# Patient Record
Sex: Female | Born: 1970 | Race: White | Hispanic: No | Marital: Married | Smoking: Never smoker
Health system: Southern US, Community
[De-identification: ages and names within clinical notes are randomized; demographics above are authoritative.]

## PROBLEM LIST (undated history)

## (undated) DIAGNOSIS — E039 Hypothyroidism, unspecified: Secondary | ICD-10-CM

## (undated) DIAGNOSIS — D689 Coagulation defect, unspecified: Secondary | ICD-10-CM

## (undated) DIAGNOSIS — R011 Cardiac murmur, unspecified: Secondary | ICD-10-CM

## (undated) DIAGNOSIS — Z5189 Encounter for other specified aftercare: Secondary | ICD-10-CM

## (undated) DIAGNOSIS — M1990 Unspecified osteoarthritis, unspecified site: Secondary | ICD-10-CM

## (undated) DIAGNOSIS — IMO0001 Reserved for inherently not codable concepts without codable children: Secondary | ICD-10-CM

## (undated) HISTORY — DX: Cardiac murmur, unspecified: R01.1

## (undated) HISTORY — PX: ECTOPIC PREGNANCY SURGERY: SHX613

## (undated) HISTORY — DX: Hypothyroidism, unspecified: E03.9

## (undated) HISTORY — PX: CERVICAL BIOPSY: SHX590

## (undated) HISTORY — DX: Unspecified osteoarthritis, unspecified site: M19.90

## (undated) HISTORY — DX: Encounter for other specified aftercare: Z51.89

## (undated) HISTORY — DX: Coagulation defect, unspecified: D68.9

## (undated) HISTORY — PX: NASAL SEPTUM SURGERY: SHX37

## (undated) HISTORY — PX: CARDIAC CATHETERIZATION: SHX172

## (undated) HISTORY — PX: TONSILLECTOMY: SUR1361

## (undated) HISTORY — DX: Reserved for inherently not codable concepts without codable children: IMO0001

---

## 1997-05-26 ENCOUNTER — Other Ambulatory Visit: Admission: RE | Admit: 1997-05-26 | Discharge: 1997-05-26 | Payer: Self-pay | Admitting: Otolaryngology

## 1997-11-07 ENCOUNTER — Other Ambulatory Visit: Admission: RE | Admit: 1997-11-07 | Discharge: 1997-11-07 | Payer: Self-pay | Admitting: Obstetrics and Gynecology

## 1998-11-02 ENCOUNTER — Other Ambulatory Visit: Admission: RE | Admit: 1998-11-02 | Discharge: 1998-11-02 | Payer: Self-pay | Admitting: Obstetrics and Gynecology

## 1998-11-09 ENCOUNTER — Emergency Department (HOSPITAL_COMMUNITY): Admission: EM | Admit: 1998-11-09 | Discharge: 1998-11-09 | Payer: Self-pay | Admitting: *Deleted

## 1999-07-11 ENCOUNTER — Encounter: Payer: Self-pay | Admitting: Emergency Medicine

## 1999-07-11 ENCOUNTER — Inpatient Hospital Stay (HOSPITAL_COMMUNITY): Admission: EM | Admit: 1999-07-11 | Discharge: 1999-07-13 | Payer: Self-pay | Admitting: Emergency Medicine

## 1999-07-12 ENCOUNTER — Encounter: Payer: Self-pay | Admitting: Cardiovascular Disease

## 1999-12-06 ENCOUNTER — Other Ambulatory Visit: Admission: RE | Admit: 1999-12-06 | Discharge: 1999-12-06 | Payer: Self-pay | Admitting: Otolaryngology

## 1999-12-06 ENCOUNTER — Encounter (INDEPENDENT_AMBULATORY_CARE_PROVIDER_SITE_OTHER): Payer: Self-pay | Admitting: Specialist

## 1999-12-10 ENCOUNTER — Observation Stay (HOSPITAL_COMMUNITY): Admission: EM | Admit: 1999-12-10 | Discharge: 1999-12-11 | Payer: Self-pay | Admitting: Emergency Medicine

## 1999-12-15 ENCOUNTER — Emergency Department (HOSPITAL_COMMUNITY): Admission: EM | Admit: 1999-12-15 | Discharge: 1999-12-15 | Payer: Self-pay | Admitting: Emergency Medicine

## 1999-12-20 ENCOUNTER — Other Ambulatory Visit: Admission: RE | Admit: 1999-12-20 | Discharge: 1999-12-20 | Payer: Self-pay | Admitting: Obstetrics and Gynecology

## 2001-05-29 ENCOUNTER — Other Ambulatory Visit: Admission: RE | Admit: 2001-05-29 | Discharge: 2001-05-29 | Payer: Self-pay | Admitting: Obstetrics and Gynecology

## 2002-01-13 ENCOUNTER — Inpatient Hospital Stay (HOSPITAL_COMMUNITY): Admission: AD | Admit: 2002-01-13 | Discharge: 2002-01-15 | Payer: Self-pay | Admitting: Obstetrics and Gynecology

## 2002-01-13 ENCOUNTER — Encounter: Payer: Self-pay | Admitting: Emergency Medicine

## 2002-01-13 ENCOUNTER — Encounter (INDEPENDENT_AMBULATORY_CARE_PROVIDER_SITE_OTHER): Payer: Self-pay

## 2002-01-20 ENCOUNTER — Inpatient Hospital Stay (HOSPITAL_COMMUNITY): Admission: AD | Admit: 2002-01-20 | Discharge: 2002-01-20 | Payer: Self-pay | Admitting: Obstetrics and Gynecology

## 2002-08-13 ENCOUNTER — Other Ambulatory Visit: Admission: RE | Admit: 2002-08-13 | Discharge: 2002-08-13 | Payer: Self-pay | Admitting: Obstetrics and Gynecology

## 2003-11-04 ENCOUNTER — Other Ambulatory Visit: Admission: RE | Admit: 2003-11-04 | Discharge: 2003-11-04 | Payer: Self-pay | Admitting: Obstetrics and Gynecology

## 2004-04-11 ENCOUNTER — Ambulatory Visit: Payer: Self-pay | Admitting: Internal Medicine

## 2004-04-11 ENCOUNTER — Encounter: Admission: RE | Admit: 2004-04-11 | Discharge: 2004-04-11 | Payer: Self-pay | Admitting: Internal Medicine

## 2004-11-20 ENCOUNTER — Ambulatory Visit: Payer: Self-pay | Admitting: Internal Medicine

## 2005-02-26 ENCOUNTER — Ambulatory Visit: Payer: Self-pay

## 2005-02-26 ENCOUNTER — Ambulatory Visit: Payer: Self-pay | Admitting: *Deleted

## 2005-02-26 ENCOUNTER — Observation Stay (HOSPITAL_COMMUNITY): Admission: AD | Admit: 2005-02-26 | Discharge: 2005-02-27 | Payer: Self-pay | Admitting: Cardiovascular Disease

## 2005-03-21 ENCOUNTER — Ambulatory Visit: Payer: Self-pay | Admitting: Cardiovascular Disease

## 2005-06-14 ENCOUNTER — Other Ambulatory Visit: Admission: RE | Admit: 2005-06-14 | Discharge: 2005-06-14 | Payer: Self-pay | Admitting: Obstetrics and Gynecology

## 2005-08-05 ENCOUNTER — Ambulatory Visit (HOSPITAL_COMMUNITY): Admission: RE | Admit: 2005-08-05 | Discharge: 2005-08-05 | Payer: Self-pay | Admitting: Obstetrics and Gynecology

## 2005-08-05 ENCOUNTER — Encounter (INDEPENDENT_AMBULATORY_CARE_PROVIDER_SITE_OTHER): Payer: Self-pay | Admitting: *Deleted

## 2005-08-19 ENCOUNTER — Inpatient Hospital Stay (HOSPITAL_COMMUNITY): Admission: AD | Admit: 2005-08-19 | Discharge: 2005-08-19 | Payer: Self-pay | Admitting: Obstetrics and Gynecology

## 2005-08-21 ENCOUNTER — Inpatient Hospital Stay (HOSPITAL_COMMUNITY): Admission: AD | Admit: 2005-08-21 | Discharge: 2005-08-22 | Payer: Self-pay | Admitting: Obstetrics and Gynecology

## 2005-09-09 ENCOUNTER — Ambulatory Visit: Payer: Self-pay | Admitting: Hematology and Oncology

## 2005-09-09 ENCOUNTER — Inpatient Hospital Stay (HOSPITAL_COMMUNITY): Admission: AD | Admit: 2005-09-09 | Discharge: 2005-09-09 | Payer: Self-pay | Admitting: Obstetrics and Gynecology

## 2005-09-17 LAB — CBC WITH DIFFERENTIAL/PLATELET
Basophils Absolute: 0 10*3/uL (ref 0.0–0.1)
MCH: 30.3 pg (ref 26.0–34.0)
MCV: 89.5 fL (ref 81.0–101.0)
MONO#: 0.6 10*3/uL (ref 0.1–0.9)
MONO%: 11 % (ref 0.0–13.0)
NEUT#: 2.7 10*3/uL (ref 1.5–6.5)
NEUT%: 49.6 % (ref 39.6–76.8)
RBC: 3.87 10*6/uL (ref 3.70–5.32)
RDW: 14.2 % (ref 11.3–14.5)
WBC: 5.5 10*3/uL (ref 3.9–10.0)

## 2005-09-17 LAB — IVY BLEEDING TIME: Bleeding Time: 7.5 Minutes (ref 2.0–8.0)

## 2005-09-17 LAB — FIBRINOGEN: Fibrinogen: 280 mg/dL (ref 204–475)

## 2005-09-17 LAB — COMPREHENSIVE METABOLIC PANEL
AST: 20 U/L (ref 0–37)
CO2: 30 mEq/L (ref 19–32)
Calcium: 9.1 mg/dL (ref 8.4–10.5)
Glucose, Bld: 80 mg/dL (ref 70–99)
Total Protein: 6.7 g/dL (ref 6.0–8.3)

## 2005-09-17 LAB — PROTHROMBIN TIME: Prothrombin Time: 13.7 seconds (ref 11.6–15.2)

## 2005-09-21 LAB — VON WILLEBRAND FACTOR MULTIMER
Ristocetin-Cofactor: 136 % (ref 50–150)
Von Willebrand Multimers: NORMAL

## 2006-02-24 ENCOUNTER — Ambulatory Visit: Payer: Self-pay | Admitting: Cardiovascular Disease

## 2006-12-17 ENCOUNTER — Telehealth: Payer: Self-pay | Admitting: Internal Medicine

## 2007-02-06 ENCOUNTER — Ambulatory Visit (HOSPITAL_BASED_OUTPATIENT_CLINIC_OR_DEPARTMENT_OTHER): Admission: RE | Admit: 2007-02-06 | Discharge: 2007-02-06 | Payer: Self-pay | Admitting: Urology

## 2007-03-25 ENCOUNTER — Ambulatory Visit: Payer: Self-pay | Admitting: Cardiovascular Disease

## 2007-05-15 ENCOUNTER — Ambulatory Visit: Payer: Self-pay

## 2007-05-15 ENCOUNTER — Encounter: Payer: Self-pay | Admitting: Cardiovascular Disease

## 2008-03-18 ENCOUNTER — Telehealth: Payer: Self-pay | Admitting: Internal Medicine

## 2008-11-23 ENCOUNTER — Telehealth: Payer: Self-pay | Admitting: Cardiovascular Disease

## 2008-11-24 DIAGNOSIS — R079 Chest pain, unspecified: Secondary | ICD-10-CM

## 2008-11-24 DIAGNOSIS — I209 Angina pectoris, unspecified: Secondary | ICD-10-CM

## 2008-11-24 DIAGNOSIS — F988 Other specified behavioral and emotional disorders with onset usually occurring in childhood and adolescence: Secondary | ICD-10-CM | POA: Insufficient documentation

## 2008-11-25 ENCOUNTER — Ambulatory Visit: Payer: Self-pay | Admitting: Internal Medicine

## 2008-11-28 ENCOUNTER — Ambulatory Visit: Payer: Self-pay | Admitting: Internal Medicine

## 2008-12-02 ENCOUNTER — Telehealth: Payer: Self-pay | Admitting: Internal Medicine

## 2008-12-02 LAB — CONVERTED CEMR LAB
HDL: 51.6 mg/dL (ref >39.00)
Triglycerides: 55 mg/dL (ref 0.0–149.0)
VLDL: 11 mg/dL (ref 0.0–40.0)

## 2008-12-08 ENCOUNTER — Ambulatory Visit: Payer: Self-pay

## 2008-12-08 ENCOUNTER — Ambulatory Visit: Payer: Self-pay | Admitting: Cardiovascular Disease

## 2008-12-08 ENCOUNTER — Encounter: Payer: Self-pay | Admitting: Internal Medicine

## 2008-12-08 ENCOUNTER — Ambulatory Visit (HOSPITAL_COMMUNITY): Admission: RE | Admit: 2008-12-08 | Discharge: 2008-12-08 | Payer: Self-pay | Admitting: Cardiovascular Disease

## 2008-12-12 ENCOUNTER — Telehealth: Payer: Self-pay | Admitting: Internal Medicine

## 2009-03-14 ENCOUNTER — Encounter: Payer: Self-pay | Admitting: Internal Medicine

## 2009-03-14 ENCOUNTER — Encounter: Admission: RE | Admit: 2009-03-14 | Discharge: 2009-03-14 | Payer: Self-pay | Admitting: Allergy

## 2009-06-20 ENCOUNTER — Encounter: Admission: RE | Admit: 2009-06-20 | Discharge: 2009-06-20 | Payer: Self-pay | Admitting: Obstetrics and Gynecology

## 2010-03-29 NOTE — Letter (Signed)
Summary: [facility]  [facility]   Imported By: Sherian Rein 03/27/2009 12:09:56  _____________________________________________________________________  External Attachment:    Type:   Image     Comment:   External Document

## 2010-06-15 ENCOUNTER — Other Ambulatory Visit: Payer: Self-pay | Admitting: Obstetrics and Gynecology

## 2010-06-15 DIAGNOSIS — N644 Mastodynia: Secondary | ICD-10-CM

## 2010-06-18 ENCOUNTER — Observation Stay (HOSPITAL_COMMUNITY)
Admission: EM | Admit: 2010-06-18 | Discharge: 2010-06-19 | Disposition: A | Discharge status: Home / Self Care | Payer: BC Managed Care – PPO | Attending: Orthopedic Surgery | Admitting: Orthopedic Surgery

## 2010-06-18 DIAGNOSIS — Y998 Other external cause status: Secondary | ICD-10-CM | POA: Insufficient documentation

## 2010-06-18 DIAGNOSIS — S61409A Unspecified open wound of unspecified hand, initial encounter: Principal | ICD-10-CM | POA: Insufficient documentation

## 2010-06-18 DIAGNOSIS — IMO0001 Reserved for inherently not codable concepts without codable children: Secondary | ICD-10-CM | POA: Insufficient documentation

## 2010-06-18 DIAGNOSIS — Y92009 Unspecified place in unspecified non-institutional (private) residence as the place of occurrence of the external cause: Secondary | ICD-10-CM | POA: Insufficient documentation

## 2010-06-18 LAB — GRAM STAIN

## 2010-06-21 ENCOUNTER — Other Ambulatory Visit: Payer: Self-pay | Admitting: Obstetrics and Gynecology

## 2010-06-21 ENCOUNTER — Ambulatory Visit
Admission: RE | Admit: 2010-06-21 | Discharge: 2010-06-21 | Disposition: A | Discharge status: Home / Self Care | Payer: BC Managed Care – PPO | Source: Ambulatory Visit | Attending: Obstetrics and Gynecology | Admitting: Obstetrics and Gynecology

## 2010-06-21 DIAGNOSIS — N644 Mastodynia: Secondary | ICD-10-CM

## 2010-06-22 LAB — CULTURE, ROUTINE-ABSCESS
Culture: NO GROWTH
Gram Stain: NONE SEEN

## 2010-06-23 LAB — ANAEROBIC CULTURE

## 2010-06-26 ENCOUNTER — Inpatient Hospital Stay: Admit: 2010-06-26 | Payer: BC Managed Care – PPO

## 2010-07-04 NOTE — Discharge Summary (Signed)
  [name], [name]                ACCOUNT NO.:  [id number]  MEDICAL RECORD NO.:  [id number]           PATIENT TYPE:  I  LOCATION:  5038                         FACILITY:  [facility]  PHYSICIAN:  Artist Pais. Taylyn Brame, M.D.DATE OF BIRTH:  May 28, 1970  DATE OF ADMISSION:  06/18/2010 DATE OF DISCHARGE:  06/19/2010                              DISCHARGE SUMMARY   PRINCIPAL DIAGNOSIS:  Infected cat bite, right hand.  PRINCIPAL PROCEDURE:  I and D.  SECONDARY DIAGNOSES:  None.  [name] [name] is a 40 year old female who was bitten by her cat and sent to the emergency department with infection in her index finger metacarpophalangeal joint area despite p.o. antibiotics for 24 hours. Examination in the emergency room revealed an obvious infection.  She was taken to the operating room on June 18, 2010, and underwent an I and D with drain placement.  On the morning of June 19, 2010, she did well, was feeling much better.  She was discharged on p.o. Avelox and Flagyl with Vicodin for pain.  She is to follow up in my office on June 20, 2010, for dressing change and then to see me on Thursday, June 21, 2010.  Discharged home, stable.     Artist Pais Mina Marble, M.D.     MAW/MEDQ  D:  06/19/2010  T:  06/20/2010  Job:  [id number]  Electronically Signed by Dairl Ponder M.D. on 07/04/2010 09:24:18 AM

## 2010-07-04 NOTE — Op Note (Signed)
  NAMECARMELITA, [name] [name]                ACCOUNT NO.:  [id number]  MEDICAL RECORD NO.:  [id number]           PATIENT TYPE:  I  LOCATION:  5038                         FACILITY:  [facility]  PHYSICIAN:  Artist Pais. Lisbet Busker, M.D.DATE OF BIRTH:  [birth year]  DATE OF PROCEDURE:  06/18/2010 DATE OF DISCHARGE:                              OPERATIVE REPORT   PREOPERATIVE DIAGNOSIS:  Right hand cat bite infection.  POSTOPERATIVE DIAGNOSIS:  Right hand cat bite infection.  PROCEDURE:  Irrigation and debridement of above with cultures.  SURGEON:  Artist Pais. Mina Marble, MD  ASSISTANT:  None.  ANESTHESIA:  General.  TOURNIQUET TIME:  6 minutes.  COMPLICATIONS:  None.  DRAINS:  None.  The patient was taken to the operating suite.  After induction of adequate general anesthesia, the right upper extremity was prepped and draped in the usual sterile fashion.  An Esmarch was used to exsanguinate the limb.  Tourniquet was inflated to 250 mm.  At this point in time, incisions were made over the metacarpophalangeal joint and extensor mechanism of the index finger of right hand where four cat bite wounds were seen, two of them had purulence.  A #15 blade was used to incise both of these of the metacarpal phalangeal joint.  The proximal one extended to the level joint and a small arthrotomy was performed.  There was no purulence in the joint.  There was purulence around the joint.  This was cultured for aerobic, anaerobic, and stat Gram stain.  The two wounds were connected under the skin with a hemostat and once this was done the wounds were thoroughly irrigated with a liter of normal saline.  The wounds were then left open and two vessel loops were placed from proximal to distal under the skin bridge exiting out from the bites and then the patient had a sterile dressing of Xeroform, 4 x 4's, and a compression wrap applied.  The patient tolerated the procedure well and went to the recovery room in  stable fashion.    Artist Pais Mina Marble, M.D.    MAW/MEDQ  D:  06/18/2010  T:  06/19/2010  Job:  [id number]  Electronically Signed by Dairl Ponder M.D. on 07/04/2010 09:24:25 AM

## 2010-07-04 NOTE — Consult Note (Signed)
  NAMEJAYLANI, [name] [name]                ACCOUNT NO.:  [id number]  MEDICAL RECORD NO.:  [id number]           PATIENT TYPE:  E  LOCATION:  [facility]                         FACILITY:  [facility]  PHYSICIAN:  Artist Pais. Trinidee Schrag, M.D.DATE OF BIRTH:  07/22/1970  DATE OF CONSULTATION:  06/18/2010 DATE OF DISCHARGE:                                CONSULTATION   REQUESTING PHYSICIAN:  Doug Sou, MD  REASON FOR CONSULTATION:  [name] [name] is a 40 year old right-hand dominant female who was bitten by her own cat yesterday, was seen at an outpatient facility in [place], was put on Bactroban lotion over the bite wounds as well as p.o. doxycycline for a presumed Augmentin allergy presents today with increasing pain, swelling, and discharge from her bite wound.  She is 40 years old.  She is right-hand dominant.  She has an allergy to Augmentin which some years ago caused some peripheral rash but no factor type symptoms.  She does not smoke or drink excessively, otherwise fairly healthy.  No other past medical or surgical history.  PHYSICAL EXAMINATION:  GENERAL:  A well-nourished female, pleasant, alert, and oriented x3. EXTREMITIES:  She has pain and swelling of the metacarpal phalangeal joint and extensor sheath area of her index finger on the dominant right hand with pain and swelling, 4 puncture wounds with purulence coming from one of the wounds.  She states that despite 3 doses of the p.o. antibiotics, it has gotten worse.  IMPRESSION AND PLAN:  This is a 40 year old female with worsening infection from a cat bite that occurred 24 hours ago despite p.o. antibiotics.  At this point in time, I will recommend a formal incision and drainage with cultures.  We will switch her to clindamycin due to her Augmentin allergy.  Admit her for IV antibiotics at least for 24 hours and then do an incision and drainage today at [facility].     Molli Hazard A. Mina Marble, M.D.     MAW/MEDQ  D:   06/18/2010  T:  06/19/2010  Job:  [id number]  Electronically Signed by Dairl Ponder M.D. on 07/04/2010 09:24:21 AM

## 2010-07-10 NOTE — Assessment & Plan Note (Signed)
[facility]                            CARDIOLOGY OFFICE NOTE   NAME:[name], [name] DEMILIO                       MRN:          [id number]  DATE:03/25/2007                            DOB:          03/19/1970    [name] [name] is seen today at the request of Dr. Debby Bud and Dr. Tomasa Rand.   I have seen [name] [name] in the past for somewhat atypical chest pain.  She  has a distant history of possible coronary artery spasm.   I believe this was catheter induced.  She had a cath back in 2001 by Dr.  Antoine Poche and had spasm of the right coronary artery.  Again, I think  this was not clinical coronary spasm but simply related to catheter  engagement.   The patient subsequently had chest pain back in 2007.   She had a cardiac CT which showed no congenital abnormalities.  Calcium  score zero and normal coronary arteries.   She is referred back now for substernal chest pain.   The patient has been under a bit of stress lately.  Her older brother  died suddenly a few weeks ago.  An autopsy is pending but he may have  had a heart attack.  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   The patient has been having atypical chest pain that is in the center of  her chest.  It does not radiate.  There can occasionally be a cold-like  feeling and some diaphoresis with it.  It can also occasionally occur at  rest, and at one time had occurred after she was exercising.   The patient has had mild exertional dyspnea, as well.  She has tried to  increase her workouts recently and dropping her percent body fat from  17% and attempting to get down to 7%.   She denies the use of any other drugs or stimulants.   Interestingly, she is on Ritalin now.  She has been on it for about 6  months.  This has been prescribed by Dr. Tomasa Rand.  She says it helps  her to focus more.   PAST MEDICAL HISTORY:  1. Recent cystoscopy by Dr. Vernie Ammons for question  of cystitis.  2. She follows up with Dr. Henderson Cloud for OB/GYN exams.  3. She is on Ritalin for possible adult attention deficit disorder.   The patient is single.  She works doing different jobs.  She is working  out now to try to enter an elite fitness competition.  She denies drug  use.  She does not smoke or drink.   FAMILY HISTORY:  Remarkable for her brother dying recently of a presumed  early heart attack.  Otherwise negative.   ALLERGIES:  1. PENICILLIN.  2. AUGMENTIN.  3. CODEINE.   MEDICATIONS:  The only medicine she takes on a regular basis is Ritalin  5 mg a day.   PHYSICAL EXAMINATION:  GENERAL:  A somewhat anxious white female.  VITAL SIGNS:  Weight is 128, blood pressure is 128/80, pulse 81,  respiratory  rate 14, afebrile.  HEENT:  Unremarkable.  Carotids normal without bruits.  No  lymphadenopathy, thyromegaly or JVP elevation.  LUNGS:  Clear with good diaphragmatic motion.  No wheezing.  HEART:  S1-S2 normal heart sounds.  PMI normal.  ABDOMEN:  Benign.  Bowel sounds positive.  No AAA.  No tenderness.  No  hepatosplenomegaly.  No hepatojugular reflux.  EXTREMITIES:  Distal pulses are intact.  No edema.  NEUROLOGIC:  Nonfocal.  SKIN:  Warm and dry.   ELECTROCARDIOGRAM:  Normal.   IMPRESSION:  1. Recurrent chest pain.  She has had a previously normal CT scan in      2007 with no congenital anomalies.  Given the fact that she is      working out vigorously and has some dyspnea, I think it is      reasonable to proceed with a stress echo.  There is no radiation      involved here, and we can rule out any significant structural heart      disease while she is exercising which will be most important since      she is trying to compete for an elite fitness program.  2. Anxiety, depression.  Follow up with Dr. Tomasa Rand.  It may be      worthwhile for her to be on an SSRI, practically in lieu of her      brother's recent death.  3. History of cystitis.  Follow up  with Dr. Vernie Ammons, and not clear to      me that a ureteral abnormality was ever found.  No obvious evidence      of recurrent urinary tract infection.  4. Adult attention deficit disorder.  I am not sure that Ritalin is      the best drug for [name] [name] to be on.  With a combination of increased      exercise activity and extreme dieting, this may make her jittery      and possibly even cause chest pain.  A non-stimulant drug such as      Concerta may be a better drug choice in regards to her focusing at      work.   Overall, I think [name] [name] is doing fine.  As long as her stress echo is  normal, I will see her on a p.r.n. basis.     [name]. Eden Emms, MD, [facility]  Electronically Signed   PCN/MedQ  DD: 03/25/2007  DT: 03/25/2007  Job #: [id number]

## 2010-07-10 NOTE — Op Note (Signed)
[name] [name], [name] [name]                ACCOUNT NO.:  [id number]   MEDICAL RECORD NO.:  [id number]          PATIENT TYPE:  AMB   LOCATION:  [facility]                         FACILITY:  [facility]   PHYSICIAN:  Mark C. Vernie Ammons, M.D.  DATE OF BIRTH:  10-18-1970   DATE OF PROCEDURE:  02/06/2007  DATE OF DISCHARGE:                               OPERATIVE REPORT   PREOP DIAGNOSIS:  1. Dysuria.  2. Rule out right ureteral calculus.   POSTOP DIAGNOSIS:  Dysuria, undetermined etiology.   PROCEDURE:  1. Cystoscopy.  2. Urethral dilatation.  3. Right retrograde pyelogram with interpretation.  4. Right ureteroscopy.   SURGEON:  Mark C. Vernie Ammons, M.D.   ANESTHESIA:  General.   DRAINS:  None.   SPECIMENS:  None.   BLOOD LOSS:  None.   COMPLICATIONS:  None.   INDICATIONS:  The patient is a 40 year old white female whom I have seen  in the past for mild urethral stricture.  She is brought to the  operating room, today, to evaluate dysuria of a rather atypical pattern.  She finds that in the morning she has such significant dysuria, that it  brings her to her knees, and then did dysuria actually subsides  throughout the day; to the point where in the evening, she is not having  any dysuria at all.  She has been evaluated and found to have clear  urine.  I found no abnormality on examination of the urethra other than  what appeared to be some slight polypoid lesions at the urethral meatus  that appeared entirely benign.  She also noted some drops of blood in  the toilet.  She has been evaluated by her gynecologist with no  abnormality being noted.  A CT scan revealed some calcifications in the  pelvis felt likely to be phleboliths.  She is brought to the OR, today,  for further evaluation understanding the risks, complications, and  alternatives.   DESCRIPTION OF OPERATION:  After informed consent, the patient was  brought to the major OR, placed on the table, and administered general  anesthesia;  then moved to the dorsal lithotomy position.  Her genitalia  were sterilely prepped and draped, and an official time-out was then  performed.   I initially performed cystoscopy using the 17-French rigid scope and 12-  degree lens.  Inspection of the urethral meatus revealed, anteriorly,  some redundant tissue that appeared almost polypoid; but entirely benign  in appearance.  Palpation of the urethra also revealed no masses,  fluctuance, induration, or other abnormality.   I passed the 17-French flexible scope under direct visualization through  the urethra, and did not note any tight strictures.  It passed into the  bladder easily, and the bladder was then fully inspected.  There was  some squamous trigonal metaplasia noted.  Ureteral orifices were normal  in configuration and position.  The efflux appeared clear from both  initially.  The bladder was otherwise free of any abnormality.   I removed the cystoscope, and gently dilated the urethra with female  sounds from 71-30 [place].  This proceeded without  difficulty or  bleeding.   I then repeated cystoscopy, this time using the 22-French cystoscope  sheath and 12-degree lens.  I then performed a right retrograde  pyelogram by passing a 8-French cone-tip ureteral catheter through the  cystoscope, and into the right ureteral orifice.  Under direct  fluoroscopy, full strength contrast was injected through the stent and  up the ureter.  I did not note any filling defects, mass effect, and the  ureter, and collecting system appeared entirely normal.   I, therefore, removed the open-ended catheter from the ureteral orifice  and under fluoroscopy watched the contrast pass down the ureter,  although there appeared to be just a slight amount of hold up at the  intramural ureter.  As I observed the efflux from the right UO.  I noted  tiny white particulate matter effluxing with the contrast.  I did not  know if this could possibly be due to  some form of stone material, or  whether there was a possibility of some form of precipitant forming as  some form of chemical reaction between the contrast and the components  of her urine.  I felt further investigation was indicated.   A 0.03 inches floppy tip guidewire was then passed through the  cystoscope, and up the right ureter under direct fluoroscopy.  The inner  portion of a ureteral access sheath was then passed over the guidewire  and gently up the right ureter.  I then removed the guidewire and  inserted the 6-French rigid ureteroscope.  I was easily able to pass  this into the right ureteral orifice, and up the right ureter and found  no stones, filling defects, or abnormalities whatsoever around the  ureter.  Therefore, the ureteroscope was removed, the bladder was  drained; and the patient was awakened, and taken to the recovery room in  stable satisfactory condition.  She tolerated the procedure well with no  intraoperative complications.   The etiology of her dysuria remains somewhat obscure.  I want to see how  she does with the dilation.  I am also going to place her on some  doxycycline for possible urethritis.  She will then return to see me in  the office in 1-2 weeks.  She was also given written instructions, and a  prescription for Vicodin #12.      Mark C. Vernie Ammons, M.D.  Electronically Signed     MCO/MEDQ  D:  02/06/2007  T:  02/06/2007  Job:  [id number]

## 2010-07-13 NOTE — Op Note (Signed)
[name] [name], [name] [name]                ACCOUNT NO.:  [id number]   MEDICAL RECORD NO.:  [id number]          PATIENT TYPE:  AMB   LOCATION:  [facility]C                           FACILITY:  [facility]   PHYSICIAN:  Guy Sandifer. Henderson Cloud, M.D. DATE OF BIRTH:  13-Feb-1971   DATE OF PROCEDURE:  08/05/2005  DATE OF DISCHARGE:                                 OPERATIVE REPORT   PREOPERATIVE DIAGNOSIS:  Atypical cervical glands.   POSTOPERATIVE DIAGNOSIS:  Atypical cervical glands.   PROCEDURE:  Cold knife conization of cervix.   SURGEON:  Harold Hedge, MD.   ANESTHESIA:  General.   SPECIMENS:  Cervical cone to pathology.   BLOOD LOSS:  Minimal.   INDICATIONS AND CONSENT:  This patient is a 40 year old married white female  status post tubal ligation with atypical endocervical glands.  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  Endocervical curettage was benign.  After discussion with the patient, she  is being admitted for a cold knife conization of the cervix to further  evaluate her previously abnormal Pap smear. The potential risks and  complications have been reviewed preoperatively including but not limited to  infection, organ damage, bleeding requiring transfusion of blood products  with a possible transfusion reaction, HIV and hepatitis acquisition, DVT,  PE, pneumonia and delayed postoperative bleeding and dyspareunia.  All  questions have been answered and consent is signed and on the chart.   PROCEDURE:  The patient is taken to the operating room where she is  identified, placed in dorsosupine position and general anesthesia is  induced. She is then placed in the dorsal lithotomy position where she is  prepped with Betadine, bladder straight catheterized and she is draped in a  sterile fashion.  A weighted speculum was placed.  Anterior retractor was  placed and anchoring sutures at the 3 and 9 o'clock positions of #0 Vicryl  are placed.  The cervix is then  injected with 1% Xylocaine with 1:200,000  epinephrine.  A scalpel was then used to perform a cold knife conization.  The specimen is then delivered and the 12 o'clock of the exocervix is marked  with a suture. Bleeding is controlled with a minimal amount of cautery  followed by Monsel's solution and Gelfoam soaked in Monsel solution was  placed in the endocervical canal as well.  All counts correct.  The patient  is awakened, taken to recovery room in stable condition.     Guy Sandifer Henderson Cloud, M.D.  Electronically Signed    JET/MEDQ  D:  08/05/2005  T:  08/05/2005  Job:  [id number]

## 2010-07-13 NOTE — Cardiovascular Report (Signed)
[place]  Patient:    [name] [name], [name] [name]                       MRN: [id number] Proc. Date: 07/13/99 Adm. Date:  [id number] Disc. Date: [id number] Attending:  Colon Branch CC:         [name]. Eden Emms, M.D. [facility]                        Cardiac Catheterization  DATE OF BIRTH:  24-Jul-1970  PRIMARY CARDIOLOGIST:  [name]. Eden Emms, M.D.  PROCEDURE:  Left heart cardiac catheterization/coronary arteriography.  INDICATIONS:  Evaluate patient with resting chest pain (411.1) and an abnormal Cardiolite study suggesting apical ischemia.  DESCRIPTION OF PROCEDURE:  Left heart catheterization was performed via the right femoral artery.  The artery was cannulated using an anterior wall puncture.  A #6 French arterial sheath was inserted via the modified Seldinger technique.  Preformed Judkins and a pigtail catheter were utilized.  The patient tolerated the procedure well and left the lab in stable condition.  RESULTS:  HEMODYNAMICS:  LV 141/21, AO 141/84.  CORONARY ARTERIOGRAPHY:  Left main:  The left main coronary artery was normal.  Left anterior descending:  The LAD was normal.  Circumflex:  The circumflex was normal.  It was the dominant vessel.  Right coronary artery:  The right coronary artery was a moderate sized nondominant vessel.  There were no stenotic lesions.  Of note, the patient did have chest discomfort immediately upon engaging the right coronary ostium each time.  This occurred before injection of contrast dye and was relieved when the catheter was removed.  LEFT VENTRICULOGRAM:  The left ventriculogram was obtained in the RAO projection.  The EF was about 65% with possibly mild apical hypokinesis.  CONCLUSION:  No focal fixed coronary artery disease.  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  PLAN:  Given the symptoms and the possibility of spasm, the patient will be treated presumptively for such.  She will  be started on Norvasc 2.5 mg q.d. and titrated as her pressure allows.  The addition of nitrates will be per Dr. Eden Emms.  She will follow with Dr. Eden Emms in approximately two weeks. DD:  07/13/99 TD:  07/17/99 Job: [id number]

## 2010-07-13 NOTE — Op Note (Signed)
[place]. [facility]  Patient:    [name] [name], [name] [name]                       MRN: [id number] Proc. Date: 12/10/99 Adm. Date:  [id number] Disc. Date: [id number] Attending:  Carlena Sax                           Operative Report  PREOPERATIVE DIAGNOSIS:  Post-tonsillectomy hemorrhage.  POSTOPERATIVE DIAGNOSIS:  Post-tonsillectomy hemorrhage.  PROCEDURE PERFORMED:  Control/cauterization of post-tonsillectomy hemorrhage.  SURGEON:  Veverly Fells. Arletha Grippe, M.D.  ANESTHESIA:  General endotracheal.  INDICATIONS FOR SURGERY:  This is a 40 year old white female who has undergone elective tonsillectomy for recurrent tonsillitis.  Four days prior to her hemorrhage, she was at home in the afternoon hours, awoke from a nap, and noticed a lot of bright red blood from her mouth and did have some vomiting of blood.  She was instructed to emergently proceed to the [facility] emergency room where she was evaluated by myself.  She was noted to have a large clot arising from the right mid tonsillar fossa.  Based on her history, physical examination, and the amount of bleeding that had incurred at home, I recommended proceeding with the above-noted surgical procedure.  I discussed extensively with her the risks and benefits in surgery, including risks from the anesthesia, infection, bleeding, and a normal recovery.  ______ type of surgery.  I entertained any questions and answered them verbally.  Informed consent has been obtained, and the patient presents for the above-noted procedure.  OPERATIVE FINDINGS:  Arterial bleeder from the right mid tonsillar fossa.  DESCRIPTION OF PROCEDURE:  The patient was taken emergently into the operating room where she was placed in a supine position.  General endotracheal anesthesia administered via the anesthesiologist without complications.  There was a significant amount of bleeding that occurred after intubation such that her mouth was totally  filled full of blood.  The head of the table was turned 90 degrees.  The patients face was draped in a standard fashion.  The Crowe-Davis mouth retractor used in the the mouth retracted central cavity used to retract the mouth open.  Blood was suctioned from her oropharynx.  The bleeder was identified involving the right mid tonsillar pole.  This was grasped with a tonsillar hemostat, was then electrocauterized without difficulty, and the area was also electrocauterized with good control of the bleeding.  The mouth was then irrigated with copious amounts of irrigation fluid and reinspected.  There was no evidence of any active bleeding.  The nasal chamber was also irrigated with a significant amount of blood and clot that was irrigated from her nasopharynx and suctioned dry.  An oral gastric tube was placed, and I did remove a significant amount of dark old blood from her stomach area without difficulty; maybe about 300-400 cc was aspirated. The oral gastric tube was removed.  Once again, both tonsillar fossas were then irrigated with warm saline solution.  There was no evidence of any active bleeding.  The Crowe-Davis mouth retractor was removed from the oral cavity without incident.  FLUIDS:  Given for procedure - approximately 1 liter of Crystalloid.  ESTIMATED BLOOD LOSS:  Approximately 150 cc.  ______ was not measured.  There were no drains, no packs.  There were no specimens sent.  The patient tolerated the procedure well without complications, extubated in the operating room and transferred to recovery  room in stable condition.  Sponge, instrument, and needle counts correct at the end of procedure.  Total time of the procedure was approximately one hour. DD:  12/12/99 TD:  12/12/99 Job: [id number]

## 2010-07-13 NOTE — H&P (Signed)
   NAME:  [name] [name], [name] [name]                          ACCOUNT NO.:  [id number]   MEDICAL RECORD NO.:  [id number]                   PATIENT TYPE:  MAT   LOCATION:  MATC                                 FACILITY:  [facility]   PHYSICIAN:  Tracie Harrier, M.D.              DATE OF BIRTH:  06/03/1970   DATE OF ADMISSION:  01/13/2002  DATE OF DISCHARGE:                                HISTORY & PHYSICAL   HISTORY OF PRESENT ILLNESS:  The patient is a 40 year old female gravida 2,  para 1 transferred from [facility] for a large hemoperitoneum and  syncopal episodes.  The patient was recently diagnosed having a pregnancy  and received methotrexate on December 31, 2001.  She was admitted this  morning through the emergency room due to syncope and extreme abdominal  pain.  She was given intravenous fluids and blood x1 unit and transferred to  [facility].   The patient was originally scheduled to undergo tubal ligation soon.   PAST MEDICAL HISTORY:  History of coronary artery spasms.   PAST SURGICAL HISTORY:  1. Normal spontaneous vaginal delivery x1.  2. Sinus surgery.  3. Tonsillectomy.   CURRENT MEDICATIONS:  None.   ALLERGIES:  AUGMENTIN.   OB HISTORY:  Normal spontaneous vaginal delivery x1.   PHYSICAL EXAMINATION:  VITAL SIGNS:  Stable.  Blood pressure 121/79, pulse  95.  GENERAL:  She is a well-developed, well-nourished, pale female supine.  HEENT:  Pale, but otherwise normal.  NECK:  Supple.  HEART:  Regular rate and rhythm without murmur.  LUNGS:  Clear.  BREASTS:  Deferred.  ABDOMEN:  Mildly distended, diffusely tender with peritoneal signs noted.   LABORATORIES:  Recent hemoglobin 11.1, positive pregnancy test.   [facility] scan at [facility] showed a large volume of fluid intraperitoneal.   ADMITTING DIAGNOSES:  1. Acute surgical abdomen.  2. Probable ruptured tubal pregnancy.  3. Requests voluntary sterilization.   PLAN:  1. Laparotomy.  2. Removal of tubal  pregnancy.  3. Bilateral tubal ligation.                                               Tracie Harrier, M.D.    REG/MEDQ  D:  01/13/2002  T:  01/13/2002  Job:  [id number]

## 2010-07-13 NOTE — Discharge Summary (Signed)
[place]. [facility]  Patient:    [name] [name], [name] [name]                       MRN: [id number] Adm. Date:  [id number] Disc. Date: 12/11/99 Attending:  Carlena Sax                           Discharge Summary  DIAGNOSES: 1. Posttonsillectomy hemorrhage. 2. Status post tonsillectomy (December 06, 1999). 3. History of recurrent, acute tonsillitis. 4. History of vasospastic angina.  DISPOSITION:  The patient is discharged to home in stable condition on December 11, 1999.  DISCHARGE MEDICATIONS: 1. Lortab elixir 2-3 tsp q.4-6h. as needed for pain. 2. Cefzil 500 mg p.o. b.i.d. x 10 days. 3. Vioxx 50 mg p.o. q.d.  DISCHARGE INSTRUCTIONS:  Liquid and soft diet.  Limited physical activity. The patient will follow up in my office in 10 days for postoperative care as scheduled.  BRIEF HISTORY:  Ms. Hyson is a 40 year old white female who underwent tonsillectomy as an outpatient on December 06, 1999.  The surgical procedure was uneventful with minimal blood loss and she was discharged on the following morning for further outpatient care.  The patients initial outpatient course was uneventful.  On the evening of December 10, 1999, she had acute sudden posttonsillectomy hemorrhage with significant oropharyngeal bleeding.  She was taken to the [facility] Emergency Department for evaluation. Dr. Carlena Sax evaluated the patient and admitted to the hospital, where she was treated in the emergency room.  HOSPITAL COURSE:  The patient was admitted on December 10, 1999, to the [facility] main operating room, via the emergency department with acute posttonsillectomy hemorrhage.  General endotracheal anesthesia was established and the patients oral cavity and oropharynx were examined.  There was a significant arterial bleeding site in the right tonsillar fossa which was cauterized with suction cautery.  There was no other active bleeding and the oral cavity  and oropharynx were irrigated and suctioned and the patient was transferred unit 6700 for postoperative care.  The following morning, the patient was doing well and she was tolerating a soft oral diet and liquids without difficulty.  She was having no further bleeding and pain control was reasonable with liquid pain medications.  She was discharged home in stable condition with the above discharge instructions.  She had normal bowel and bladder function and her hospital course was uncomplicated. DD:  12/11/99 TD:  12/11/99 Job: [id number]

## 2010-07-13 NOTE — Assessment & Plan Note (Signed)
[facility]                            CARDIOLOGY OFFICE NOTE   NAME:[name], [name] RAMAKER                       MRN:          [id number]  DATE:02/24/2006                            DOB:          [birth year]    [name] [name] returns today for followup.  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  She had a cardiac CT a year ago with a  calcium score of 1.5.  She is not having any significant chest pain.   Her review of systems are remarkable for a hard plantar cyst on the left  foot.  She is actually putting some verapamil ointment on this.  I told  her I did not think there would be any systemic absorption from the  bottom of her foot.  However, she has also had some bladder spasms, and  is on [name] [name].   This has caused some lightheadedness and dizziness.  I told her that  since it is an antispasmodic, she should talk to [name] [name] regarding  this.  Her blood pressure tends to run a little low to begin with, and  she actually has had some postural hypotension in the past.   She will follow up with [name] [name] in regards to this.   From a cardiac perspective, she is otherwise doing well.  She is active  without chest pain, shortness of breath, PND or orthopnea.  She needs a  refill on her Nitro spray.  She is on Clindamycin 300 mg for 7 days,  verapamil ointment and .  She takes a baby aspirin a day.   ALLERGIES:  PENICILLIN AND AUGMENTIN AND CODEINE.   PHYSICAL EXAMINATION:  On exam, blood pressure is 117/80.  She is not  postural.  Pulse is 67 and regular.  HEENT:  Normal.  There are no carotid bruits, no JVP elevation.  LUNGS:  Clear.  There is an S1, S2 with normal heart sounds.  ABDOMEN:  Benign.  She does have a small laceration on the left hand in the pointer finger.  This is healing well.  Distal pulses are intact with no edema.  NEURO:  Nonfocal.  She has a hard 1 cm cyst in the middle of her left foot, attached to the  fascia.   IMPRESSION:  Stable coronary artery spasm.  Refill for Nitro spray  given.  She has been asymptomatic for a while, and I do not think she  needs to be on chronic vasodilators since she tends to get lightheaded  with it.   She will follow up with [name] [name] in regards to her bladder spasm, and  try to avoid antispasmodics.  She will follow up with [name] [name] in  regards to the cyst on her foot.   Her risk factors are well-modified.  She had a very low calcium score a  year ago.  She does not need to be on a Statin drug.  Her EKG today was  normal except for a minor 1st degree heart block.   I will see her back in a year.  [name] [name]. Eden Emms, MD, [name] [name]  Electronically Signed    PCN/MedQ  DD: 02/24/2006  DT: 02/24/2006  Job #: [id number]

## 2010-07-13 NOTE — H&P (Signed)
NAMETHEOLA, [name] NO.:  [id number]   MEDICAL RECORD NO.:  [id number]            [name] TYPE:   LOCATION:                                 FACILITY:   PHYSICIAN:  Cecil Cranker, M.D.     DATE OF BIRTH:   DATE OF ADMISSION:  DATE OF DISCHARGE:                                HISTORY & PHYSICAL   PRIMARY CARDIOLOGIST:  [name], M.D.   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   [name] [name] was last seen here in [name] office in 2002 by Dr. Eden Emms, at which  time she continued to remain stable with no recurrent chest pain.  She was  instructed to use Imdur and Norvasc as needed.   She initially presented to [place] in May 2001 when she was hospitalized with  chest pain, ruled out for myocardial infarction, had initially been referred  for an exercise stress Cardiolite suggestive of apical ischemia.  She then  underwent coronary angiography [name] following day by Dr. Eden Emms, revealing  normal coronary arteries with suggestion of probable ostial and RCA spasm.  Of note, [name] [name] also stated that her chest pain during [name]  catheterization was identical to her clinical presentation.   [name] [name] had been doing well since then until this past Thursday.  Since  then, she has had intermittent bouts of midsternal chest pain, described as  dull, with associated left upper extremity numbness and occasional radiation  to [name] back.  However, she states that these are dissimilar from her 2001  presentation.  Although they are unpredictable in onset, they seem to be  exacerbated by exertion (i.e. walking up stairs).  However, she also notes  exacerbation after drinking coffee, for example.  She walked into our office  this morning with complaint of chest pain.  Her  electrocardiogram, however,  reveals normal sinus rhythm with no ischemic changes.   [name] [name] continues to report some mild, residual pain which is not  reproducible by deep inspiration/movement or palpation.   ALLERGIES:  AUGMENTIN.   CURRENT MEDICATIONS:  None.   PAST MEDICAL HISTORY:  History of orthostatic hypotension, status post tubal  pregnancy, and tonsillectomy.   SOCIAL HISTORY:  [name] [name] is married, has one child.  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  She has not smoked in two  years but states that this was not on a very regular basis.  She denies  alcohol use.   FAMILY HISTORY:  Father age 27, status post bypass surgery at age 19; status  post myocardial infarction one month ago.   REVIEW OF SYSTEMS:  As noted per HPI.  Denies any symptoms of heartburn or  history of peptic ulcer disease.  Remaining systems negative.   PHYSICAL EXAMINATION:  Blood pressure 118/78, pulse 87, regular.  GENERAL:  A 40 year old female in no apparent distress.  HEENT:  Normocephalic, atraumatic.  NECK:  Preserved bilateral carotid pulses without bruits; no JVD.  LUNGS:  Clear to auscultation in all fields.  HEART:  Regular rate and rhythm (S1 S2).  Soft grade 1/6 systolic ejection  murmur in [name] lower left sternal border.  ABDOMEN:  Soft, nontender with intact bowel sounds.  EXTREMITIES:  Preserved bilateral femoral and distal pulses without edema.  NEUROLOGIC:  No focal deficit.   IMPRESSION:  1.  Unstable angina pectoris.      1.  Question recurrent Prinzmetal's angina pectoris.      2.  History of normal coronary arteries with ostial right coronary          artery spasm by cardiac catheterization May 2001.      3.  Normal left ventricular function; question mild apical hypokinesis.  2.  History of tobacco.  3.  Family history of premature coronary artery disease.   PLAN:  [name] [name] will be admitted directly to [facility] for  further  evaluation and management.  Serial cardiac markers will be cycled,  and we will start [name] [name] on aspirin, Norvasc 2.5 every day, intravenous  nitroglycerin and heparin.  If [name] [name] rules out for myocardial  infarction, [name] recommendation is to proceed with early follow-up exercise  stress echocardiogram back here in our office.      Gene Serpe, P.A. [facility]    ______________________________  E. Graceann Congress, M.D.    GS/MEDQ  D:  02/26/2005  T:  02/26/2005  Job:  [id number]   cc:   Rosalyn Gess. Norins, M.D. [facility]  [address]  [place]  [place] [address]

## 2010-07-13 NOTE — Discharge Summary (Signed)
NAMELACRESIA, DARWISH                ACCOUNT NO.:  [id number]   MEDICAL RECORD NO.:  [id number]          PATIENT TYPE:  INP   LOCATION:  9319                          [facility]ACILITY:  [facility]   PHYSICIAN:  Guy Sandifer. Henderson Cloud, M.D. DATE OF BIRTH:  1971/02/15   DATE OF ADMISSION:  08/20/2005  DATE OF DISCHARGE:  08/22/2005                                 DISCHARGE SUMMARY   ADMITTING DIAGNOSIS:  Cervical bleeding, status post cold knife conization  of cervix.   DISCHARGE DIAGNOSIS:  Cervical bleeding, status post cold knife conization  of cervix.   REASON FOR ADMISSION:  This patient is a 41 year old married white female  status post tubal ligation, who underwent cold knife conization of the  cervix on August 05, 2005.  She presented to the hospital with a history of  heavy vaginal bleeding into her bed.  Evaluation at the time of admission  revealed a hemoglobin of 8.2, stable vital signs and large amount of clot  obtained from the vagina.   HOSPITAL COURSE:  The patient was taken to the operating room, where she  underwent suturing of the cone biopsy site and   Dictation ended at this point.      Guy Sandifer Henderson Cloud, M.D.  Electronically Signed     JET/MEDQ  D:  08/22/2005  T:  08/22/2005  Job:  [id number]

## 2010-07-13 NOTE — Op Note (Signed)
NAMEBREAUNA, [name] [name]                ACCOUNT NO.:  [id number]   MEDICAL RECORD NO.:  [id number]          PATIENT TYPE:  OIB   LOCATION:  9319                          [facility]ILITY[facility]   PHYSICIAN:  Zelphia Cairo, MD    DATE OF BIRTH:  04-Jan-1971   DATE OF PROCEDURE:  08/20/2005  DATE OF DISCHARGE:                                 OPERATIVE REPORT   PREOPERATIVE DIAGNOSIS:  Cervical bleeding status post cone biopsy and  08/05/2005.   POSTOPERATIVE DIAGNOSIS:  Cervical bleeding status post cone biopsy and  08/05/2005.   PROCEDURE:  Cauterization and suturing of cone biopsy site.   SURGEON:  Zelphia Cairo, MD   ASSISTANT:  None.   ANESTHESIA:  General.   SPECIMEN:  None.   ESTIMATED BLOOD LOSS:  1000 mL prior to procedure; approximately 200 mL at  OR time.   COMPLICATIONS:  None.   CONDITION:  Stable and extubated to recovery room,   DESCRIPTION OF PROCEDURE:  [name] [name] is a 40 year old who presented to the MAU  with complaints of heavy vaginal bleeding.  She awoke at 4 a.m. this morning  covered in bleeding; and was taken by EMS to our maternity admissions unit.  At that time she was noted be bleeding very heavy.  One speculum was placed  in the vagina and a large 250 mL clot was removed from the vagina; and  active bleeding was noted from the cervix.  It was felt that the bleeding  was too brisk and lighting was not adequate to provide hemostasis in the  MAU.  Vaginal packing was placed to hold pressure during transport to the  OR.  Risks, benefits, and alternative were explained including the risk of  blood transfusion, and informed consent was obtained.   The patient was taken to the operating room where general anesthesia was  obtained.  She was placed in the dorsal lithotomy position using Allen  stirrups.  She was prepped and draped in sterile fashion and her bladder was  emptied for 300 mL of clear urine using a red rubber catheter. Vaginal  packing was removed.  The  bleeding had slowed.  There continued to be some  oozing from the edges and base of the cone biopsy site.  Sutures were placed  at 3 o'clock and 9 o'clock on the cervix using Vicryl.  These sutures were  used to aid in visualization; and the base of the cone biopsy site was  cauterized using the Bovie.  Once hemostasis was assured and there was no  further bleeding from the cone biopsy site, a piece of Gelfoam was placed in  the base of the cervix and the two retention sutures were  tied together to provide pressure on the Gelfoam.  The sutures were then  cut, the site was observed for several minutes, and hemostasis was assured.  The speculum was removed.  The patient tolerated the procedure well.  She  was taken to the recovery room.      Zelphia Cairo, MD  Electronically Signed     GA/MEDQ  D:  08/20/2005  T:  08/20/2005  Job:  [id number]

## 2010-07-13 NOTE — Op Note (Signed)
NAMELILLYANNE, [name]                          ACCOUNT NO.:  [id number]   MEDICAL RECORD NO.:  [id number]                   PATIENT TYPE:  MAT   LOCATION:  MATC                                 [facility]   PHYSICIAN:  Tracie Harrier, M.D.              DATE OF BIRTH:  06/16/1970   DATE OF PROCEDURE:  01/13/2002  DATE OF DISCHARGE:                                 OPERATIVE REPORT   PREOPERATIVE DIAGNOSES:  1. Acute surgical abdomen.  2. Probable ruptured tubal pregnancy.  3. Voluntary sterilization.   POSTOPERATIVE DIAGNOSES:  1. Ruptured right tubal pregnancy.  2. Hemoperitoneum.   PROCEDURES:  1. Laparotomy.  2. Right partial salpingectomy.  3. Left partial salpingectomy.   SURGEON:  Tracie Harrier, M.D.   ANESTHESIA:  General.   ESTIMATED BLOOD LOSS:  500 cc.   COMPLICATIONS:  None.   FINDINGS:  At time of laparotomy, a large hemoperitoneum was noted.  Approximately 500 cc of blood was intraperitoneal.  A right tubal pregnancy  about 2 x 2 cm in size was noted.  The left adnexa and uterus were  visualized and noted to be normal.   A tubal ligation was performed on the left tube at patient request.   DESCRIPTION OF PROCEDURE:  The patient was taken to the operating room,  where a general endotracheal anesthetic was administered.  The patient was  placed on the operating table in the supine position, the abdomen was  prepped and draped in the usual sterile fashion with Betadine and sterile  drapes.  A Foley catheter was inserted.  The abdomen was entered through a  Pfannenstiel incision and carried down sharply in the usual fashion.  The  peritoneum was atraumatically entered.  A large hemoperitoneum was  encountered with both clotted and noncoagulated blood.  The bowel was packed  away and a self-retaining retractor was placed, and the right tube was  identified as the source of bleeding.  A tubal pregnancy on the right was  noted.  This area was clamped  with a Kelly clamp to stop the bleeding.  This  area of the tube was excised.  The tube was then sutured with two  interrupted sutures of 0 Vicryl.  Good hemostasis was noted from this  operative area.  The pelvis was then thoroughly irrigated, and all clots and  blood as possible was removed.  The left tube was grasped and about 2 cm  from the uterine fundus, the tubes were tied at patient request.  An  approximately 2 cm segment of tube was tied off using two strands of 0  Vicryl and the Bovie cautery.  An approximately 1 cm segment of tube was  excised between these two existing ligatures.  Good hemostasis was noted  from this area.   Once again the pelvis was thoroughly irrigated and all clots were removed as  possible.  The operative  areas were hemostatic.  Attention was then turned  to closure.  All packs and retractors were removed.  The rectus muscle and  anterior peritoneum were closed with a running suture of #1 Vicryl.  The  fascia was closed with a running suture of #1 Vicryl as well.  The  subcutaneous tissue was irrigated and made hemostatic using the Bovie  cautery.  The skin reapproximated with staples and a sterile dressing  applied.   Final sponge, needle, and instrument count was correct x3.  The patient  tolerated this very well.  There were no perioperative complications.  She  did receive an intraoperative antibiotic.                                               Tracie Harrier, M.D.    REG/MEDQ  D:  01/13/2002  T:  01/13/2002  Job:  [id number]

## 2010-07-13 NOTE — Discharge Summary (Signed)
[name] [name], [name] [name]                          ACCOUNT NO.:  [id number]   MEDICAL RECORD NO.:  [id number]                   PATIENT TYPE:  INP   LOCATION:  9325                                 FACILITY:  [facility]   PHYSICIAN:  Guy Sandifer. Arleta Creek, M.D.           DATE OF BIRTH:  12/07/1970   DATE OF ADMISSION:  01/13/2002  DATE OF DISCHARGE:  01/15/2002                                 DISCHARGE SUMMARY   ADMITTING DIAGNOSIS:  Surgical abdomen, probable ruptured ectopic pregnancy.   DISCHARGE DIAGNOSIS:  Right ectopic pregnancy.   PROCEDURE:  On January 13, 2002 laparotomy with right partial salpingectomy  and left fallopian tube ligation.   REASON FOR ADMISSION:  This patient is a 40 year old married white female  who was scheduled for tubal ligation later this month.  She had a positive  pregnancy test.  She presented to a local clinic in town and, by the  patient's report, had an ultrasound which failed to identify the location of  her pregnancy.  She was given methotrexate followed by Cytotec several days  later.  She reports passing some tissue which she took to be the pregnancy.  However, she presented to [facility] emergency department on January 13, 2002  with complaints of abdominal pain.  Evaluation there including CAT scan was  consistent with a positive pregnancy test and a hemoperitoneum highly  suspicious for ectopic pregnancy.  The patient was given IV fluids.  She did  have, apparently, a hypotensive episode and was given a unit of blood.  However, the emergency department physician felt the patient was appropriate  for transfer to [facility].   HOSPITAL COURSE:  The patient was admitted to [facility] and underwent  the above procedure without complication.  The patient's hemoglobin on  November 19 is 9.0.  On postoperative day #1 she is stable with a soft  abdomen and is afebrile.  Hemoglobin is 8.1.  On the day of discharge she is  ambulating, passing flatus,  tolerating a regular diet.  Vital signs remain  stable.  At 8 a.m. temperature is 100.1.  Repeat temperature is 98.7.  CBC  on the day of discharge reveals white count of 7.6 and hemoglobin of 8.0.  Incision is healing well.  Pathology is pending.   CONDITION ON DISCHARGE:  Good.   DIET:  Regular as tolerated.   ACTIVITY:  No lifting, no operation of automobiles, no vaginal entry.   SPECIAL INSTRUCTIONS:  The patient is to call for problems including but not  limited to temperature of 101 or greater, increasing pain, persistent nausea  or vomiting, or heavy vaginal bleeding.   MEDICATIONS:  1. Percocet 5/325 mg #30 one to two p.o. q.6h. p.r.n.  2. Ibuprofen 600 mg p.o. q.6h. p.r.n.  3.     Chromagen FE #30 one p.o. q.d.  4. Multivitamin once a day.   FOLLOW-UP:  In the office in  two weeks.                                               Guy Sandifer Arleta Creek, M.D.    JET/MEDQ  D:  01/15/2002  T:  01/16/2002  Job:  [id number]

## 2010-07-13 NOTE — Discharge Summary (Signed)
[name] [name], [name] [name]                ACCOUNT NO.:  [id number]   MEDICAL RECORD NO.:  [id number]          PATIENT TYPE:  INP   LOCATION:  3705                         FACILITY:  [facility]   PHYSICIAN:  [name], M.D.     DATE OF BIRTH:  11/30/1970   DATE OF ADMISSION:  02/26/2005  DATE OF DISCHARGE:  02/27/2005                                 DISCHARGE SUMMARY   PROCEDURES:  Cardiac CT scan.   PRIMARY CARDIOLOGIST:  [name], M.D.   PRINCIPAL DIAGNOSES:  1.  Non-cardiac chest pain.      1.  Normal cardiac CT scan, February 27, 2005.      2.  Normal cardiac enzymes.  2.  History of Prinzmetal angina.      1.  Normal coronary arteries by cardiac catheterization, 2001.  3.  Normal left ventricular function.  4.  History of tobacco.   REASON FOR ADMISSION:  Ms. 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 [name],  who presented to the office as a walk-in with complaint of recurrent chest  pain.   LABORATORY DATA:  Normal serial cardiac markers.  TSH 1.64.  Normal  electrolytes, renal function, and liver enzymes.  Normal CBC on admission.   Admission chest x-ray:  No acute changes.   HOSPITAL COURSE:  Following direct admission from our office, where the  patient presented as a walk-in with complaint of a several day history of  recurrent chest pain, the patient ruled out for myocardial infarction with  all serial cardiac markers within normal limits.  She presented on no home  medications.  She was thus placed back on a low dose of Norvasc, which the  patient had been on in the past for treatment of possible Prinzmetal angina,  as well as aspirin, intravenous nitroglycerin, and heparin.  The patient was seen by Dr. [name] who recommended proceeding with a  cardiac CT scan.  This resulted in a calcium score of 1.5% and revealed  only  a small area of calcium in the mid left anterior descending artery.  Left  ventricular function was normal.  Dr. Eden Emms noted no significant non-  cardiac structural abnormalities.  The results were reviewed with the patient, who is subsequently cleared for  discharge by Dr. [name].   MEDICATION RECOMMENDATIONS:  The patient will be discharged on sublingual  nitroglycerin.   DISCHARGE MEDICATIONS:  Nitrostat 0.4 mg as directed.   INSTRUCTIONS:  Follow up with Dr. [name] on March 21, 2005 at 8:45  a.m.   DISCHARGE DURATION TIME:  Twenty five minutes including physician time.      Gene Serpe, P.A. [facility]    ______________________________  [name], M.D.    GS/MEDQ  D:  02/27/2005  T:  02/27/2005  Job:  [id number]   cc:   Rosalyn Gess. Norins, M.D. [facility]  [address]  [place]  [place] [address]

## 2010-07-13 NOTE — Discharge Summary (Signed)
[facility]  Patient:    [name] [name], [name] [name]                       MRN: [id number] Adm. Date:  [id number] Disc. Date: [id number] Attending:  Colon Branch Dictator:   Delton See, P.A.                           Discharge Summary  DATE OF BIRTH:  1970/12/29.  HISTORY ON ADMISSION:  This is a 40 year old female with a history of a heart murmur as a child, otherwise, no previous cardiac history, who presented to [facility] Emergency Room for evaluation of chest pain.  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  She was at work on the day of admission, sitting at her desk, when she suddenly developed severe chest pain which radiated to her left arm.  After approximately 30 minutes of pain, the patient reported her symptoms to Dr. Dorma Russell, who sent her to the emergency room.  Since arriving at 9:30 that morning, the pain had been waxing and waning until the early afternoon.  She described her pain as substernal, with some pain and numbness of her left arm.  The pain was dull in nature and increased with inspiration.  It was occasionally stabbing.  It was worse with movement but not reproducible with pressing on her sternum.  She denied any recent musculoskeletal injury to stress.  She denies stress at work.  Her pain was rated at a 6 on a 1-in-10 scale.  There was no shortness of breath.  There was some dizziness, some presyncope, some anxiety.  No nausea.  No diaphoresis.  The patient was admitted for further evaluation.  Cardiac risk factors negative for diabetes and negative for hypertension.  The patient reported elevated cholesterol levels since her teenage years.  She has not been a smoker.  She is mildly overweight.  She has a positive family history of coronary artery disease.  Her father had coronary artery bypass graft surgery at age 56.  PAST MEDICAL HISTORY:  Significant for renal calculi.  She  has had sinus surgery and has noted, elevated cholesterol levels.  ALLERGIES:  PENICILLIN causes a rash.  There is some questionable allergy to CONTRAST DYE.  MEDICATIONS:  Birth control pills.  SOCIAL HISTORY:  Patient is separated.  She has one child.  She lives in [place].  She does not use alcohol or tobacco.  She works at [employer]. [employer] office.  She drinks a half a cup of coffee per day.  She occasionally takes diet and herbal medications but has not had any recently.  REVIEW OF SYSTEMS:  Significant for recent tonsillitis, history of migraine headaches, occasional previous chest pain, a history of hematuria with recent renal calculi and a history of seizures as a teenager which subsequently resolved.  HOSPITAL COURSE:  As noted, this patient was admitted to [facility] for further evaluation of chest pain.  She was scheduled for an exercise Cardiolite as well as a 2-D echo to evaluate her heart murmur.  The patient did well with the exercise Cardiolite, exercising just over 10 minutes without any ischemic changes on her EKG; however, the Cardiolite came back positive with mild apical ischemia, ejection fraction 60%.  The patient had a 2-D echo which was normal by a preliminary report from Dr. Theron Arista C. Nishan.  The patient was scheduled for a heart catheterization which was performed Jul 13, 1999 by Dr. Rollene Rotunda.  There was no ischemia, although the patient was noted to have spasm and her pain was reproduced by the catheter.  There was a question of mild apical hypokinesis, although her ejection fraction was 65%.  Dr. Antoine Poche felt that the patient had spasm and recommended Norvasc 2.5 mg daily.  The patient was discharged later that day in improved condition.  LABORATORY AND X-RAY FINDINGS:  A cholesterol level revealed cholesterol 175, triglycerides 109, HDL 58, LDL was 95.  Cardiac enzymes were negative.  A room air blood gas revealed a pH 7.446, PCO2 33.2,  PO2 of 93, O2 saturation 98%.  A CBC revealed hemoglobin 12.8, hematocrit 35.5, WBC 7000, platelets 195,000. Chemistries revealed a BUN of 11, creatinine 0.5, potassium 3.8.  ALT was low at 32.  As noted, her cardiac enzymes were negative.  While in the emergency room, patient had a CT scan of the chest that was negative for pulmonary emboli.  She also had a CT scan of the lower extremities which was negative for a DVT; this was performed secondary to the fact that she had been on birth control pills.  An EKG showed normal sinus rhythm with occasional premature supraventricular complexes but otherwise was unremarkable.  DISCHARGE MEDICATIONS: 1. Ortho-Novum to be taken as previously. 2. Norvasc 2.5 mg each day. 3. Nitroglycerin p.r.n. for chest pain.  It should be noted that the patient did have some chest pain while in the hospital that was relieved with nitroglycerin.  ACTIVITIES:  The patient was instructed not to drive or do anything of a strenuous nature for at least two days.  DIET:  She was to be on a low cholesterol diet.  SPECIAL INSTRUCTIONS:  She was told to call the office if she had any increased pain, swelling or bleeding from her groin.  FOLLOWUP:  She was to follow up with Dr. Eden Emms on June 4th at 10:30 a.m., at which time she would see Dian Queen, P.A.C.  She was to find a family doctor to follow with as well.  PROBLEM LIST AT TIME OF DISCHARGE: 1. Chest pain, myocardial infarction ruled out. 2. Two-dimensional echocardiogram performed this admission, essentially    normal. 3. Exercise Cardiolite, Jul 12, 1999, revealing apical ischemia. 4. Cardiac catheterization, Jul 13, 1999, revealing spasm. 5. Positive family history of early coronary artery disease. 6. Previous history of elevated cholesterol levels. 7. Birth control pills. DD:  07/13/99 TD:  07/14/99 Job: [id number]

## 2010-08-06 ENCOUNTER — Encounter (INDEPENDENT_AMBULATORY_CARE_PROVIDER_SITE_OTHER): Payer: Self-pay | Admitting: Surgery

## 2010-08-13 ENCOUNTER — Encounter (INDEPENDENT_AMBULATORY_CARE_PROVIDER_SITE_OTHER): Payer: Self-pay | Admitting: Surgery

## 2010-08-13 DIAGNOSIS — M1990 Unspecified osteoarthritis, unspecified site: Secondary | ICD-10-CM

## 2010-08-20 ENCOUNTER — Encounter (INDEPENDENT_AMBULATORY_CARE_PROVIDER_SITE_OTHER): Payer: Self-pay | Admitting: Surgery

## 2010-08-20 ENCOUNTER — Ambulatory Visit (INDEPENDENT_AMBULATORY_CARE_PROVIDER_SITE_OTHER): Payer: BC Managed Care – PPO | Admitting: Surgery

## 2010-08-20 VITALS — BP 118/88 | HR 100 | Temp 97.6°F | Ht 61.0 in | Wt 129.0 lb

## 2010-08-20 DIAGNOSIS — R599 Enlarged lymph nodes, unspecified: Secondary | ICD-10-CM

## 2010-08-20 DIAGNOSIS — R1031 Right lower quadrant pain: Secondary | ICD-10-CM | POA: Insufficient documentation

## 2010-08-20 NOTE — Patient Instructions (Signed)
Check right groin monthly.  If any significant increase in size, call office for re-evaluation by Dr. Gerrit Friends.  Otherwise, will repeat exam in 3 months.

## 2010-08-20 NOTE — Progress Notes (Signed)
Subjective:     Patient ID: [name] [name], female   DOB: 1970-04-02, 40 y.o.   MRN: [id number]    BP 118/88  Pulse 100  Temp 97.6 F (36.4 C)  Wt 129 lb (58.514 kg)  LMP 08/18/2010    HPI Patient presents for reevaluation of a right groin mass. She was previously seen by Dr. Claud Kelp in October 2011. She is noted a persistent mass in the right groin. She was seen by Dr. Harold Hedge and referred to me for reevaluation.  Review of Systems Patient has not noted any significant change in size of the mass in the right groin. She does note that with palpation pain radiates down the right leg. Dr. Henderson Cloud apparently noted a second mass in the right groin which he would like to have evaluated today. Patient denies any change in bowel habits. She denies any evidence of hernia with exercise.    Objective:   Physical Exam Examination of the right groin shows 2 subcutaneous nodular densities. The larger lesion is at the level of the inguinal ligament. It measures 1.5 cm in size. It is soft mobile and nontender consistent with a normal lymph node. There is a second nodular density which overlies the right femoral artery and nerve. It measures approximately 5 mm in size. It is quite firm. With compression the patient notes discomfort radiating down the right leg. There are no cutaneous changes. With Valsalva and sit up maneuver there is no evidence of hernia.    Assessment:     #1. Soft tissue mass right groin, 1.5 cm, likely benign lymph node  #2. 5 mm nodular density right groin, likely neuroma, minimally symptomatic    Plan:     The patient and I discussed possible surgical intervention for these 2 lesions in the right groin. At this point she is minimally symptomatic and is not desire surgical intervention. I have asked her to return in 3 months for repeat physical examination. I think it is very likely both of these are benign findings. Patient is comfortable with this plan and will  return for reevaluation in 3 months.

## 2010-10-02 ENCOUNTER — Encounter: Payer: Self-pay | Admitting: Cardiology

## 2010-10-02 ENCOUNTER — Encounter: Payer: Self-pay | Admitting: *Deleted

## 2010-10-02 ENCOUNTER — Telehealth: Payer: Self-pay | Admitting: Cardiovascular Disease

## 2010-10-02 ENCOUNTER — Ambulatory Visit (INDEPENDENT_AMBULATORY_CARE_PROVIDER_SITE_OTHER): Payer: BC Managed Care – PPO | Admitting: Cardiology

## 2010-10-02 VITALS — BP 120/82 | HR 86 | Resp 14 | Ht 61.0 in | Wt 127.0 lb

## 2010-10-02 DIAGNOSIS — R079 Chest pain, unspecified: Secondary | ICD-10-CM

## 2010-10-02 DIAGNOSIS — I209 Angina pectoris, unspecified: Secondary | ICD-10-CM

## 2010-10-02 NOTE — Assessment & Plan Note (Signed)
This is noncardiac. I recommended she avoid any activity that makes this worse, use local heat, anti-inflammatory for 7 days. If she develops any fever, cough, or sputum or hemoptysis she will let us know. Reassurance given

## 2010-10-02 NOTE — Telephone Encounter (Signed)
Patient calling would like to be seen today if possible. 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 Patient wants to rule this out. Having trouble breathing.

## 2010-10-02 NOTE — Telephone Encounter (Signed)
Spoke with pt, she woke in the middle of the night with a sharpe pain in her chest into her back. Her pulse is running 100-103 and her 02 sats are 93-97% in her office. She is unable to take anything but very shallow breath because the pain is so severe. With any movement or breath she develops a severe, sharpe pain that runs through her back into her chest and up into her neck and arm. Discussed with dr wall, pt will come to the office now to be seen [name] [name]

## 2010-10-02 NOTE — Assessment & Plan Note (Signed)
Patient reassured this is not coronary spasm.

## 2010-10-02 NOTE — Patient Instructions (Signed)
It is important to limit strenous activity at this time until your chest muscle soreness is relieved You may use a heating pad to the chest area to help alleviate muscular discomfort.  You  May also use Advil or Aleve for this muscular discomfort.  Please call our office if you are experiencing any increased shortness of breath, fever, or hemoptysis  ( spitting up blood)  Your physician recommends that you schedule a follow-up appointment in:  As needed with Dr. Daleen Squibb

## 2010-10-02 NOTE — Telephone Encounter (Signed)
Per patient calling. chestpain in back, arm. Nurse took pulse is 103. Some tackycardia. Pt stress the importance of been seen today.

## 2010-10-02 NOTE — Progress Notes (Signed)
HPI [name] [name] comes in today as an add-on for left-sided chest discomfort.  She exercised harder the weekend. Since that time she's been having some soreness in her left chest. Today it got worse and was going from her left scapula through her chest anteriorly. No associated symptoms other than being extremely anxious. It does hurt to take a deep breath or twists her torso. She denies any fever chills or cough or any productive sputum.  Check cardiac catheterization in May of 2001 which was normal. Was felt that perhaps the time she had coronary spasm.  Her EKG today shows normal sinus rhythm with no ST segment changes.   Past Medical History  Diagnosis Date  . Heart murmur   . Blood transfusion   . Clotting disorder     unknown  . Arthritis     Joint pain    Past Surgical History  Procedure Date  . Tonsillectomy   . Cervical biopsy   . Nasal septum surgery   . Ectopic pregnancy surgery   . Cardiac catheterization     groin 2001    Family History  Problem Relation Age of Onset  . Diabetes Father   . Hyperlipidemia Father   . Sleep apnea Father   . Hypertension Brother   . Sleep apnea      History   Social History  . Marital Status: Married    Spouse Name: N/A    Number of Children: 1  . Years of Education: N/A   Occupational History  .     Social History Main Topics  . Smoking status: Never Smoker   . Smokeless tobacco: Not on file  . Alcohol Use: No  . Drug Use: No  . Sexually Active: Not on file   Other Topics Concern  . Not on file   Social History Narrative  . No narrative on file    Allergies  Allergen Reactions  . ZOX:[id number] Acid+Aspartame     REACTION: rash  . Augmentin (Amoxicillin-Pot Clavulanate) Rash    No current outpatient prescriptions on file.    ROS Negative other than HPI.   PE General Appearance: well developed, well nourished in no acute distress, anxious. HEENT: symmetrical face, PERRLA, good  dentition  Neck: no JVD, thyromegaly, or adenopathy, trachea midline Chest: symmetric without deformity, mild tenderness over the left breast, no masses. No sternal tenderness. Cardiac: PMI non-displaced, RRR, normal S1, S2, no gallop or murmur, no rub Lung: clear to ausculation and percussion, no rub Vascular: all pulses full without bruits  Abdominal: nondistended, nontender, good bowel sounds, no HSM, no bruits Extremities: no cyanosis, clubbing or edema, no sign of DVT, no varicosities  Skin: normal color, no rashes Neuro: alert and oriented x 3, non-focal Pysch: normal affect Filed Vitals:   10/02/10 1219  BP: 120/82  Pulse: 86  Resp: 14  Height: 5\' 1"  (1.549 m)  Weight: 127 lb (57.607 kg)  SpO2: 100%    EKG  Labs and Studies Reviewed.   No results found for this basename: WBC, HGB, HCT, MCV, PLT      Chemistry   No results found for this basename: NA, K, CL, CO2, BUN, CREATININE, GLU   No results found for this basename: CALCIUM, ALKPHOS, AST, ALT, BILITOT       Lab Results  Component Value Date   CHOL 177 11/28/2008   Lab Results  Component Value Date   HDL 51.60 11/28/2008   Lab Results  Component Value Date  LDLCALC 114* 11/28/2008   Lab Results  Component Value Date   TRIG 55.0 11/28/2008   Lab Results  Component Value Date   CHOLHDL 3 11/28/2008   No results found for this basename: HGBA1C   No results found for this basename: ALT, AST, GGT, ALKPHOS, BILITOT   No results found for this basename: TSH

## 2010-10-11 ENCOUNTER — Ambulatory Visit: Payer: BC Managed Care – PPO | Admitting: Cardiology

## 2010-12-03 LAB — POCT HEMOGLOBIN-HEMACUE
Hemoglobin: 11.9 — ABNORMAL LOW
Operator id: 268271

## 2011-02-14 ENCOUNTER — Telehealth (INDEPENDENT_AMBULATORY_CARE_PROVIDER_SITE_OTHER): Payer: Self-pay

## 2011-02-14 NOTE — Telephone Encounter (Signed)
Per Dr.Gerkin patient does not need to follow up in 3 months here in the office for right groin mass.  MRI of pelvis in September  has been reviewed. Follow up prn.

## 2011-03-01 ENCOUNTER — Encounter (INDEPENDENT_AMBULATORY_CARE_PROVIDER_SITE_OTHER): Payer: Self-pay | Admitting: Orthopaedic Surgery

## 2012-02-20 ENCOUNTER — Other Ambulatory Visit: Payer: Self-pay

## 2012-05-14 ENCOUNTER — Other Ambulatory Visit: Payer: Self-pay | Admitting: Obstetrics and Gynecology

## 2012-05-14 DIAGNOSIS — N6320 Unspecified lump in the left breast, unspecified quadrant: Secondary | ICD-10-CM

## 2012-10-22 ENCOUNTER — Other Ambulatory Visit (HOSPITAL_COMMUNITY): Payer: Self-pay | Admitting: Gastroenterology

## 2012-10-22 DIAGNOSIS — R1310 Dysphagia, unspecified: Secondary | ICD-10-CM

## 2012-10-27 ENCOUNTER — Other Ambulatory Visit (HOSPITAL_COMMUNITY): Payer: Self-pay | Admitting: Gastroenterology

## 2012-10-27 DIAGNOSIS — R1310 Dysphagia, unspecified: Secondary | ICD-10-CM

## 2012-10-29 ENCOUNTER — Other Ambulatory Visit (HOSPITAL_COMMUNITY): Payer: BC Managed Care – PPO

## 2012-10-29 ENCOUNTER — Ambulatory Visit (HOSPITAL_COMMUNITY): Payer: BC Managed Care – PPO

## 2012-11-02 ENCOUNTER — Ambulatory Visit (HOSPITAL_COMMUNITY)
Admission: RE | Admit: 2012-11-02 | Discharge: 2012-11-02 | Disposition: A | Discharge status: Home / Self Care | Payer: BC Managed Care – PPO | Source: Ambulatory Visit | Attending: Gastroenterology | Admitting: Gastroenterology

## 2012-11-02 DIAGNOSIS — R1310 Dysphagia, unspecified: Secondary | ICD-10-CM

## 2012-11-02 DIAGNOSIS — R1313 Dysphagia, pharyngeal phase: Secondary | ICD-10-CM | POA: Insufficient documentation

## 2012-11-02 NOTE — Procedures (Addendum)
Objective Swallowing Evaluation: Modified Barium Swallowing Study  Patient Details  Name: [name] [name] MRN: [id number] Date of Birth: 05/12/1970  Today's Date: 11/02/2012 Time: 1110-1140 SLP Time Calculation (min): 30 min  Past Medical History:  Past Medical History  Diagnosis Date  . Heart murmur   . Blood transfusion   . Clotting disorder     unknown  . Arthritis     Joint pain   Past Surgical History:  Past Surgical History  Procedure Laterality Date  . Tonsillectomy    . Cervical biopsy    . Nasal septum surgery    . Ectopic pregnancy surgery    . Cardiac catheterization      groin [id number]   HPI:  42 year old female with PMH of tonsillectomy with post-op arterial hemorrhage in 1998 seen for c/o difficulty swallowing. Patient reports solid food dysphagia characterized by globus sensation as well as voice changes worsening over the past 1-2 months. She reports ability to manually remove solid food particles from back of throat. Also reports that last week, while doing so she heard a "pop" and noted a small amount of blood on her fingers. Questions whether scar tissue fron previous surgery is contributing to difficulty.      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 Oropharyngeal swallow normal, including deflection of epiglottis,  with the exception of mild hesitation in return of epiglottis to neutral position, resulting in minimal vallecular residuals and trace penetration of thin liquids, both of qhich spontaneously clear with dry swallows. Note that patient c/o pharyngeal residue on right side only. SLP provided verbal and visual cueing for various head postures. Note that a chin tuck was minimally effective however a right head turn was significantly effective at  decreasing both appearance and c/o the epiglottic hesitation described above. Suspect structural variation possibly located along posterior pharyngeal wall on the right is bipassed with head turn to right, directing epiglottis towards the left. Recommend continuation of a regular diet, thin liquids, however with use of right head turn, multiple swallows, and alternation of solid and liquids to aid in clearance. Also recommend consideration of ENT consult for better view of structure. No SLP f/u needed at this time.     Treatment Recommendation  No treatment recommended at this time    Diet Recommendation Regular;Thin liquid   Liquid Administration via: Cup;Straw Medication Administration: Whole meds with liquid Supervision: Patient able to self feed Compensations: Multiple dry swallows after each bite/sip;Follow solids with liquid;Slow rate;Small sips/bites Postural Changes and/or Swallow Maneuvers: Head turn right during swallow    Other  Recommendations Recommended Consults: Consider ENT evaluation Oral Care Recommendations: Oral care BID   Follow Up Recommendations  None            General HPI: 42 year old female with PMH of tonsillectomy with post-op arterial hemorrhage in 1998 seen for c/o difficulty swallowing. Patient reports solid food dysphagia characterized by globus sensation as well as voice changes worsening over the past 1-2 months. She reports ability to manually remove solid food particles from back of throat. Also reports that last week, while doing so she heard a "pop" and noted a small amount of blood on her fingers. Questions whether scar tissue fron previous surgery is contributing to difficulty.  Type of Study: Modified Barium Swallowing Study Reason for Referral: Objectively evaluate swallowing  function Diet Prior to this Study: Regular;Thin liquids Temperature Spikes Noted: No Respiratory Status: Room air History of Recent Intubation: No Behavior/Cognition:  Alert;Cooperative;Pleasant mood Oral Cavity - Dentition: Adequate natural dentition Oral Motor / Sensory Function: Within functional limits Self-Feeding Abilities: Able to feed self Patient Positioning: Upright in chair Baseline Vocal Quality: Hoarse Volitional Cough: Strong Volitional Swallow: Able to elicit Anatomy: Within functional limits (although question structural changes (see impression)) Pharyngeal Secretions: Not observed secondary MBS    Reason for Referral Objectively evaluate swallowing function   Oral Phase Oral Preparation/Oral Phase Oral Phase: WFL   Pharyngeal Phase Pharyngeal Phase Pharyngeal Phase: Impaired Pharyngeal - Thin Pharyngeal - Thin Cup: Penetration/Aspiration during swallow (hesitation on return of epiglottis to neutral position. ) Penetration/Aspiration details (thin cup): Material enters airway, CONTACTS cords then ejected out Pharyngeal - Thin Straw: Penetration/Aspiration during swallow (hesitation on return of epiglottis to neutral position. ) Penetration/Aspiration details (thin straw): Material enters airway, remains ABOVE vocal cords and not ejected out Pharyngeal - Solids Pharyngeal - Puree: Pharyngeal residue - valleculae (hesitation on return of epiglottis to neutral position. ) Pharyngeal - Mechanical Soft: Pharyngeal residue - valleculae (hesitation on return of epiglottis to neutral position. ) Pharyngeal - Pill:  (unable to orally transit, expectorated)  Cervical Esophageal Phase    GO    Cervical Esophageal Phase Cervical Esophageal Phase: [facility]    Functional Assessment Tool Used: skilled clinical judgement Functional Limitations: Swallowing Swallow Current Status (Z6109): At least 1 percent but less than 20 percent impaired, limited or restricted Swallow Goal Status [phone]): At least 1 percent but less than 20 percent impaired, limited or restricted Swallow Discharge Status [phone]): At least 1 percent but less than 20 percent  impaired, limited or restricted   [facility] MA, CCC-SLP [phone]  Ferdinand Lango Meryl 11/02/2012, 1:10 PM

## 2012-11-18 ENCOUNTER — Encounter: Payer: Self-pay | Admitting: *Deleted

## 2012-11-23 ENCOUNTER — Ambulatory Visit (INDEPENDENT_AMBULATORY_CARE_PROVIDER_SITE_OTHER): Payer: BC Managed Care – PPO | Admitting: Cardiovascular Disease

## 2012-11-23 VITALS — BP 124/84 | HR 72 | Ht 61.0 in | Wt 126.0 lb

## 2012-11-23 DIAGNOSIS — Z0000 Encounter for general adult medical examination without abnormal findings: Secondary | ICD-10-CM

## 2012-11-23 DIAGNOSIS — I209 Angina pectoris, unspecified: Secondary | ICD-10-CM

## 2012-11-23 DIAGNOSIS — Z8249 Family history of ischemic heart disease and other diseases of the circulatory system: Secondary | ICD-10-CM

## 2012-11-23 NOTE — Assessment & Plan Note (Signed)
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

## 2012-11-23 NOTE — Patient Instructions (Addendum)
Your physician recommends that you schedule a follow-up appointment in:  AS NEEDED CALCIUM SCORE  $150.00 OUT OF POCKET   CALL   [phone]  ASK  FOR  [name] [name] IF  YOU WOULD LIKE TO SCHEDULE

## 2012-11-23 NOTE — Progress Notes (Signed)
Patient ID: [name] [name], female   DOB: [birth year], 42 y.o.   MRN: [id number] I have seen [name] [name] in the past for somewhat atypical chest pain. 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 I believe this was catheter induced. She had a cath back in 2001 by Dr. Antoine Poche and had spasm of the right coronary artery. Again, I think this was not clinical coronary spasm but simply related to catheter engagement. The patientsubsequently had chest pain back in 2007. She had a cardiac CT which showed no congenital abnormalities. Calcium score zero and normal coronary arteries.  Last seen by Dr Daleen Squibb in August of 2012 for atypical pain with no w/u  No chest pain Has concerns with family history of CAD in brother and father LDL 127  disussed diet and dont think at this level she needs statin  ROS: Denies fever, malais, weight loss, blurry vision, decreased visual acuity, cough, sputum, SOB, hemoptysis, pleuritic pain, palpitaitons, heartburn, abdominal pain, melena, lower extremity edema, claudication, or rash.  All other systems reviewed and negative  General: Affect appropriate Healthy:  appears stated age HEENT: normal Neck supple with no adenopathy JVP normal no bruits no thyromegaly Lungs clear with no wheezing and good diaphragmatic motion Heart:  S1/S2 no murmur, no rub, gallop or click PMI normal Abdomen: benighn, BS positve, no tenderness, no AAA no bruit.  No HSM or HJR Distal pulses intact with no bruits No edema Neuro non-focal Skin warm and dry No muscular weakness   No current outpatient prescriptions on file.   No current facility-administered medications for this visit.    Allergies  Amoxicillin-pot clavulanate and Augmentin  Electrocardiogram:  10/02/10  SR rate 86 normal   SR rate 76  Normal   Assessment and Plan

## 2012-11-25 ENCOUNTER — Ambulatory Visit (INDEPENDENT_AMBULATORY_CARE_PROVIDER_SITE_OTHER)
Admission: RE | Admit: 2012-11-25 | Discharge: 2012-11-25 | Disposition: A | Discharge status: Home / Self Care | Payer: BC Managed Care – PPO | Source: Ambulatory Visit | Attending: Cardiovascular Disease | Admitting: Cardiovascular Disease

## 2012-11-25 DIAGNOSIS — Z8249 Family history of ischemic heart disease and other diseases of the circulatory system: Secondary | ICD-10-CM

## 2012-11-27 ENCOUNTER — Other Ambulatory Visit: Payer: Self-pay | Admitting: *Deleted

## 2012-11-27 MED ORDER — ATORVASTATIN CALCIUM 20 MG PO TABS: 20.0000 mg | ORAL_TABLET | Freq: Every day | ORAL | Status: DC

## 2012-11-27 NOTE — Telephone Encounter (Signed)
SPOKE WITH PT  RE  CA  SCORE PER  DR NISHAN PT HAS  CA SCORE OF 40  PT  IS   99TH PERCENTILE FOR  AGE  AND SEX  DR Eden Emms RECOMMENDS FOR PT   TO  TAKE ATORVASTATIN  20 MG  EVERY DAY  AND RECHECK  LIPID LIVER  IN  3 MONTHS .Zack Seal

## 2013-01-06 ENCOUNTER — Telehealth: Payer: Self-pay | Admitting: Cardiovascular Disease

## 2013-01-06 NOTE — Telephone Encounter (Signed)
New Problem:  Pt states she came in for a calcium scores test and pt wants to know what she is suppose to do now. Pt states she wants to activate her MyChart. Please advise

## 2013-01-06 NOTE — Telephone Encounter (Signed)
Spoke with patient who states she has received calcium score a few weeks ago but she is unsure of what this means.  Patient states Dr. Eden Emms advised her to start Atorvastatin 20 mg daily and another physician (I believe she said primary care) told her she could just take 5 mg daily.  Patient states she would like to see Dr. Eden Emms again before the end of the year to discuss her treatment plan and to find out if he recommends further testing.  Patient states she recently had lipid particle testing done and that her results report that she is high in the 75th percentile.  Patient states "I should probably follow Dr. Fabio Bering advice for the Atorvastatin shouldn't I?"  I advised patient that since Dr. Eden Emms is a cardiologist that she should follow his advice and patient agreed.  Patient has multiple questions regarding what she should do next.  I scheduled patient for ov on 12/5. Patient verbalized understanding and thanked me for my help.

## 2013-01-29 ENCOUNTER — Ambulatory Visit (INDEPENDENT_AMBULATORY_CARE_PROVIDER_SITE_OTHER): Payer: BC Managed Care – PPO | Admitting: Cardiovascular Disease

## 2013-01-29 VITALS — Wt 132.0 lb

## 2013-01-29 DIAGNOSIS — R079 Chest pain, unspecified: Secondary | ICD-10-CM

## 2013-01-29 NOTE — Progress Notes (Signed)
Patient ID: [name] [name], female   DOB: November 30, 1970, 42 y.o.   MRN: [id number]  [name] [name] in the past for somewhat atypical chest pain. 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 I believe this was catheter induced. She had a cath back in 2001 by Dr. Antoine Poche and had spasm of the right coronary artery. Again, I think this was not clinical coronary spasm but simply related to catheter engagement. The patientsubsequently had chest pain back in 2007. She had a cardiac CT which showed no congenital abnormalities. Calcium score zero and normal coronary arteries.  Last seen by Dr Daleen Squibb in August of 2012 for atypical pain with no w/u No chest pain Has concerns with family history of CAD in brother and father    Calcium score 40 in September which was 90th percentile for age and sex matched controls    Showed her her images. She has been trying both lipitor 20 mg and crestor 5 but only qod.  The lipitor may cause heal pain. Repeat LDL on some statin Was 86 with HDL over 75    She has a lot of anxiety with multiple somatic complaints Dyspnea and palpitations  Worried about her family history   ROS: Denies fever, malais, weight loss, blurry vision, decreased visual acuity, cough, sputum, SOB, hemoptysis, pleuritic pain, palpitaitons, heartburn, abdominal pain, melena, lower extremity edema, claudication, or rash.  All other systems reviewed and negative  General: Affect appropriate Healthy:  appears stated age HEENT: normal Neck supple with no adenopathy JVP normal no bruits no thyromegaly Lungs clear with no wheezing and good diaphragmatic motion Heart:  S1/S2 no murmur, no rub, gallop or click PMI normal Abdomen: benighn, BS positve, no tenderness, no AAA no bruit.  No HSM or HJR Distal pulses intact with no bruits No edema Neuro non-focal Skin warm and dry No muscular weakness   Current Outpatient Prescriptions  Medication Sig Dispense Refill  . atorvastatin (LIPITOR) 20 MG  tablet Take 1 tablet (20 mg total) by mouth daily.  30 tablet  3   No current facility-administered medications for this visit.    Allergies  Amoxicillin-pot clavulanate and Augmentin  Electrocardiogram:  9/29  SR rate 76  Normal   Assessment and Plan

## 2013-01-29 NOTE — Patient Instructions (Signed)
Your physician wants you to follow-up in:  YEAR WITH DR Haywood Filler will receive a reminder letter in the mail two months in advance. If you don't receive a letter, please call our office to schedule the follow-up appointment. Your physician has recommended you make the following change in your medication:  CRESTOR  5 MG   EVERY  OTHER  DAY Your physician has requested that you have an exercise tolerance test. For further information please visit [email or web address] Please also follow instruction sheet, as given.

## 2013-01-29 NOTE — Assessment & Plan Note (Signed)
Atypical and mostly resolved Anxious about high percentile of her calcium score.  Baby aspirin  F/U ETT  Take crestor 5 mg qod F/U labs with Dr Renne Crigler in 6 months

## 2013-02-04 ENCOUNTER — Other Ambulatory Visit: Payer: Self-pay | Admitting: Dermatology

## 2013-02-05 ENCOUNTER — Encounter (HOSPITAL_COMMUNITY): Payer: BC Managed Care – PPO

## 2013-02-09 ENCOUNTER — Ambulatory Visit (HOSPITAL_COMMUNITY)
Admission: RE | Admit: 2013-02-09 | Discharge: 2013-02-09 | Disposition: A | Discharge status: Home / Self Care | Payer: BC Managed Care – PPO | Source: Ambulatory Visit | Attending: Cardiovascular Disease | Admitting: Cardiovascular Disease

## 2013-02-09 DIAGNOSIS — R079 Chest pain, unspecified: Secondary | ICD-10-CM

## 2013-02-16 ENCOUNTER — Encounter: Payer: Self-pay | Admitting: Cardiovascular Disease

## 2013-03-15 ENCOUNTER — Telehealth: Payer: Self-pay | Admitting: Cardiovascular Disease

## 2013-03-15 NOTE — Telephone Encounter (Signed)
Follow Up   Pt following up of test results. Please call.

## 2013-03-15 NOTE — Telephone Encounter (Signed)
Returned call to patient no answer.LMTC. 

## 2013-03-16 NOTE — Telephone Encounter (Signed)
Received call back from patient [facility].Stated she has never received results.Message sent to Dr.Nishan.

## 2013-03-17 NOTE — Telephone Encounter (Signed)
PT  AWARE OF  GXT  RESULTS ./CY 

## 2013-08-04 ENCOUNTER — Ambulatory Visit (INDEPENDENT_AMBULATORY_CARE_PROVIDER_SITE_OTHER): Payer: BC Managed Care – PPO

## 2013-08-04 ENCOUNTER — Ambulatory Visit: Payer: Self-pay

## 2013-08-04 VITALS — Ht 61.0 in | Wt 131.0 lb

## 2013-08-04 DIAGNOSIS — M722 Plantar fascial fibromatosis: Secondary | ICD-10-CM

## 2013-08-04 DIAGNOSIS — M79609 Pain in unspecified limb: Secondary | ICD-10-CM

## 2013-08-04 MED ORDER — TRIAMCINOLONE ACETONIDE 40 MG/ML IJ SUSP: 40.0000 mg | Freq: Once | INTRAMUSCULAR | Status: AC

## 2013-08-04 NOTE — Patient Instructions (Signed)

## 2013-08-04 NOTE — Progress Notes (Signed)
   Subjective:    Patient ID: [name] [name], female    DOB: 03-28-1970, 43 y.o.   MRN: [id number]  HPI Comments: N plantar fibromas L B/L plantar arch D 3 months O increased walking exercising C lumps are increasing in size and number, painful A exercising, weightbearing T golfball massage, epsom salt soak, [facility] rx'd orthotics     Review of Systems  Cardiovascular:       Hypercholesterolemia  All other systems reviewed and are negative.      Objective:   Physical Exam Neurovascular status is intact pedal pulses palpable epicritic and proprioceptive sensations intact patient is doing some exercising couple months ago and it exacerbated her plantar fascia mid band both nodular dermatoses Magan plantar fascia bilateral a somewhat enlarged left slightly more than right both become painful active again patient previous he got to series of steroid injections which for lesions considerably although not as bad as original size they are somewhat enlarged again to neurovascular status intact orthopedic exam otherwise unremarkable unchanged no history of acute trauma or injury noted no open wounds or ulcerations noted       Assessment & Plan:  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 At this time inject her infiltrated each plantar fascial fibroma with 20 mg Kenalog 10 the uterus Xylocaine plain. The injections were delivered to each Dr. Lyman Speller bilateral mid arch patient [name] [name] the injections well as instructed in ice and massage the area and mobilization of the lesions recheck in one month consider additional injections if needed at that time. In the interim restrict for any ballistic or running activities for at least 3-4 weeks before she can resume exercise. Recheck in one month for followup also recommended warm compress ice pack as instructed daily  Alvan Dame DPM

## 2013-09-01 ENCOUNTER — Ambulatory Visit: Payer: BC Managed Care – PPO

## 2013-11-24 ENCOUNTER — Other Ambulatory Visit: Payer: Self-pay | Admitting: Obstetrics and Gynecology

## 2013-11-25 LAB — CYTOLOGY - PAP

## 2014-01-30 NOTE — Progress Notes (Signed)
Patient ID: [name] [name], female   DOB: 12/19/1970, 43 y.o.   MRN: [id number] [name] DresserConnie in the past for somewhat atypical chest pain. 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 I believe this was catheter induced. She had a cath back in 2001 by Dr. Antoine [name] and had spasm of the right coronary artery. Again, I think this was not clinical coronary spasm but simply related to catheter engagement. The patientsubsequently had chest pain back in 2007. She had a cardiac CT which showed no congenital abnormalities. Calcium score zero and normal coronary arteries.  Seen by Dr [name] [name]  in August of 2012 for atypical pain with no w/u No chest pain Has concerns with family history of CAD in brother and father    Calcium score 40 in September 2014  which was 90th percentile for age and sex matched controls   She has been trying both lipitor 20 mg and crestor 5 but only qod. The lipitor may cause heal pain. Repeat LDL on some statin Was 86 with HDL over 75   She has a lot of anxiety with multiple somatic complaints Dyspnea and palpitations Worried about her family history  ETT 12/14 was normal   Also concerned about aneurysm in right femoral area at previous cath sight   ROS: Denies fever, malais, weight loss, blurry vision, decreased visual acuity, cough, sputum, SOB, hemoptysis, pleuritic pain, palpitaitons, heartburn, abdominal pain, melena, lower extremity edema, claudication, or rash.  All other systems reviewed and negative  General: Affect appropriate Healthy:  appears stated age HEENT: normal Neck supple with no adenopathy JVP normal no bruits no thyromegaly Lungs clear with no wheezing and good diaphragmatic motion Heart:  S1/S2 no murmur, no rub, gallop or click PMI normal Abdomen: benighn, BS positve, no tenderness, no AAA no bruit.  No HSM or HJR Distal pulses intact with no bruits  Reactive lymph node Right femoral area with prominent pulse  No edema Neuro  non-focal Skin warm and dry No muscular weakness   Current Outpatient Prescriptions  Medication Sig Dispense Refill  . levocetirizine (XYZAL) 5 MG tablet Take 5 mg by mouth as directed.     . meloxicam (MOBIC) 15 MG tablet Take 15 mg by mouth as needed for pain (Hip).    . VENTOLIN HFA 108 (90 BASE) MCG/ACT inhaler Inhale 2 puffs into the lungs every 6 (six) hours as needed.     . zolpidem (AMBIEN) 10 MG tablet Take by mouth at bedtime.  4   Current Facility-Administered Medications  Medication Dose Route Frequency Provider Last Rate Last Dose  . triamcinolone acetonide (KENALOG-40) injection 40 mg  40 mg Other Once [name] [name], DPM        Allergies  Amoxicillin-pot clavulanate and Augmentin  Electrocardiogram:  2014  NSR normal ECG  Today NSR rate 87 normal and no change   Assessment and Plan

## 2014-01-31 ENCOUNTER — Ambulatory Visit (INDEPENDENT_AMBULATORY_CARE_PROVIDER_SITE_OTHER): Payer: PRIVATE HEALTH INSURANCE | Admitting: Cardiovascular Disease

## 2014-01-31 ENCOUNTER — Encounter: Payer: Self-pay | Admitting: Cardiovascular Disease

## 2014-01-31 VITALS — BP 134/82 | Ht 61.0 in | Wt 138.8 lb

## 2014-01-31 DIAGNOSIS — R1030 Lower abdominal pain, unspecified: Secondary | ICD-10-CM

## 2014-01-31 DIAGNOSIS — R1909 Other intra-abdominal and pelvic swelling, mass and lump: Secondary | ICD-10-CM

## 2014-01-31 DIAGNOSIS — R1900 Intra-abdominal and pelvic swelling, mass and lump, unspecified site: Secondary | ICD-10-CM

## 2014-01-31 DIAGNOSIS — R079 Chest pain, unspecified: Secondary | ICD-10-CM

## 2014-01-31 DIAGNOSIS — F341 Dysthymic disorder: Secondary | ICD-10-CM

## 2014-01-31 DIAGNOSIS — R1031 Right lower quadrant pain: Secondary | ICD-10-CM

## 2014-01-31 MED ORDER — ROSUVASTATIN CALCIUM 5 MG PO TABS: 5.0000 mg | ORAL_TABLET | Freq: Every day | ORAL | Status: DC

## 2014-01-31 NOTE — Patient Instructions (Signed)
Your physician wants you to follow-up in:   YEAR  WITH  DR Haywood FillerNISHAN  You will receive a reminder letter in the mail two months in advance. If you don't receive a letter, please call our office to schedule the follow-up appointment.  Your physician has recommended you make the following change in your medication:  CRESTOR  5 MG   QOD Your physician recommends that you return for lab work in:  FASTING LIPID LIVER  IN   3 MONTHS    MARCH  2016 Your physician has requested that you have a lower or upper extremity arterial duplex. This test is an ultrasound of the arteries in the legs or arms. It looks at arterial blood flow in the legs and arms. Allow one hour for Lower and Upper Arterial scans. There are no restrictions or special instructions

## 2014-01-31 NOTE — Assessment & Plan Note (Signed)
Catheter induced no evidence of native spasm or coronary dissection No chest pain stable

## 2014-01-31 NOTE — Assessment & Plan Note (Signed)
Chronic has a palpable reactive lymph node She is concerned about damage to artery from old cath sight Will order LE duplex US but doubt chronic pseudo aneurysm or other issues with vasculature

## 2014-01-31 NOTE — Assessment & Plan Note (Signed)
Improved f/u primary leads to somatic complaints

## 2014-02-03 ENCOUNTER — Ambulatory Visit (HOSPITAL_COMMUNITY): Payer: 59 | Attending: Cardiovascular Disease | Admitting: *Deleted

## 2014-02-03 DIAGNOSIS — R1031 Right lower quadrant pain: Secondary | ICD-10-CM | POA: Insufficient documentation

## 2014-02-03 DIAGNOSIS — R1909 Other intra-abdominal and pelvic swelling, mass and lump: Secondary | ICD-10-CM

## 2014-02-03 DIAGNOSIS — R1900 Intra-abdominal and pelvic swelling, mass and lump, unspecified site: Secondary | ICD-10-CM

## 2014-02-03 NOTE — Progress Notes (Signed)
Right Arterial Pseudoaneurysm Duplex

## 2014-05-02 ENCOUNTER — Other Ambulatory Visit (INDEPENDENT_AMBULATORY_CARE_PROVIDER_SITE_OTHER): Payer: PRIVATE HEALTH INSURANCE | Admitting: *Deleted

## 2014-05-02 DIAGNOSIS — R071 Chest pain on breathing: Secondary | ICD-10-CM

## 2014-05-02 LAB — LIPID PANEL
Cholesterol: 161 mg/dL (ref 0–200)
HDL: 64 mg/dL
LDL Cholesterol: 86 mg/dL (ref 0–99)
NonHDL: 97
Total CHOL/HDL Ratio: 3
Triglycerides: 53 mg/dL (ref 0.0–149.0)
VLDL: 10.6 mg/dL (ref 0.0–40.0)

## 2014-05-02 LAB — HEPATIC FUNCTION PANEL
ALBUMIN: 4 g/dL (ref 3.5–5.2)
ALK PHOS: 27 U/L — AB (ref 39–117)
ALT: 12 U/L (ref 0–35)
AST: 17 U/L (ref 0–37)
Bilirubin, Direct: 0.1 mg/dL (ref 0.0–0.3)
TOTAL PROTEIN: 7.5 g/dL (ref 6.0–8.3)
Total Bilirubin: 0.2 mg/dL (ref 0.2–1.2)

## 2014-05-02 NOTE — Addendum Note (Signed)
Addended by: Tonita PhoenixBOWDEN, Geffrey Michaelsen K on: 05/02/2014 07:42 AM   Modules accepted: Orders

## 2014-05-02 NOTE — Addendum Note (Signed)
Addended by: BOWDEN, ROBIN K on: 05/02/2014 07:42 AM   Modules accepted: Orders  

## 2014-09-22 ENCOUNTER — Telehealth: Payer: Self-pay

## 2014-09-22 ENCOUNTER — Other Ambulatory Visit: Payer: Self-pay

## 2014-09-22 MED ORDER — ROSUVASTATIN CALCIUM 5 MG PO TABS: 5.0000 mg | ORAL_TABLET | Freq: Every day | ORAL | Status: DC

## 2014-10-07 ENCOUNTER — Telehealth: Payer: Self-pay | Admitting: Cardiovascular Disease

## 2014-10-07 NOTE — Telephone Encounter (Signed)
Reviewed 2014 calcium score results with patient. She has already been in touch with medical records for copy of this report. She thanks me for calling her back.

## 2014-10-07 NOTE — Telephone Encounter (Signed)
Voicemail full -- would not let me leave a message

## 2014-10-07 NOTE — Telephone Encounter (Signed)
New Message  Pt called for her calcium score test results for August of 2014.

## 2014-10-13 ENCOUNTER — Encounter: Payer: Self-pay | Admitting: Podiatry

## 2014-10-13 ENCOUNTER — Ambulatory Visit (INDEPENDENT_AMBULATORY_CARE_PROVIDER_SITE_OTHER): Payer: PRIVATE HEALTH INSURANCE | Admitting: Podiatry

## 2014-10-13 VITALS — BP 129/85 | HR 76 | Resp 16

## 2014-10-13 DIAGNOSIS — M722 Plantar fascial fibromatosis: Secondary | ICD-10-CM

## 2014-10-13 MED ORDER — TRIAMCINOLONE ACETONIDE 10 MG/ML IJ SUSP
10.0000 mg | Freq: Once | INTRAMUSCULAR | Status: AC
  Administered 2014-10-13: 10 mg

## 2014-10-16 NOTE — Progress Notes (Signed)
Subjective:     Patient ID: [name] [name], female   DOB: 1970-07-22, 44 y.o.   MRN: [id number]  HPI patient presents with nodules in both arch which have become symptomatic again and have been treated reasonably well with injection treatment in the past but on the left one she feels like there is a new one is also been tender   Review of Systems     Objective:   Physical Exam Neurovascular status intact muscle strength adequate with a string of nodules in the plantar left over right arch that measures approximately 3 cm in length by 7 mm in width on the left and 2 cm on the right with 5 mm on the right. 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 Today I went ahead and I injected into the plantar fascia bilateral 3 mg Kenalog 5 mg Xylocaine advised on heat and ice therapy and reappoint when symptomatic or if they should grow in size

## 2014-10-21 ENCOUNTER — Ambulatory Visit: Payer: Self-pay | Admitting: Podiatry

## 2014-10-27 ENCOUNTER — Telehealth: Payer: Self-pay | Admitting: Cardiovascular Disease

## 2014-10-27 ENCOUNTER — Encounter: Payer: Self-pay | Admitting: *Deleted

## 2014-10-27 DIAGNOSIS — F419 Anxiety disorder, unspecified: Secondary | ICD-10-CM | POA: Insufficient documentation

## 2014-10-27 NOTE — Telephone Encounter (Signed)
REVIEWED RECORDS  APPEARS  DEPRESSION HAS  NOT BEEN DIAGNOSED   REMOVED  FROM  LIST  PT   NOTIFIED ./CY

## 2014-10-27 NOTE — Telephone Encounter (Signed)
New Message  Pt called states that she has an incorrect diagnosis code on her chart that says that she is Diagnosed with depression and that she was diagnosed for 7 years. She states that this is so wrong and that she has never been depressed. Requests a call back to discuss and to have this removed immediatly.

## 2015-01-13 ENCOUNTER — Telehealth: Payer: Self-pay | Admitting: *Deleted

## 2015-01-13 NOTE — Telephone Encounter (Signed)
SPOKE WITH PT THIS  AM  RE   NOTE  THAT  WAS  FAXED  RE  CRESTOR  IS NO LONGER  COVERED  BY   INSURANCE  AND  GEN  IS  EXPENSIVE PT  I S  WILLING TO TRY  ATORVASTATIN    REVIEWED LAST  OFFICE NOTE  AND APPEARS  LIPITOR  CAUSED  HEEL PAIN    AFTER  DISCUSSION PT  IS GOING TO CALL  DRUG STORE AND  SEE  IF INSURANCE  WILL COVER    GEN  CRESTOR  AWAITING CALL BACK ./CY

## 2015-03-24 NOTE — Progress Notes (Signed)
Patient ID: [name] [name], female   DOB: [birth year], 45 y.o.   MRN: [id number]   [name] [name] in the past for somewhat atypical chest pain. 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 I believe this was catheter induced. She had a cath back in 2001 by Dr. Antoine Poche and had spasm of the right coronary artery. Again, I think this was not clinical coronary spasm but simply related to catheter engagement. The patientsubsequently had chest pain back in 2007. She had a cardiac CT which showed no congenital abnormalities. Calcium score zero and normal coronary arteries.  Seen by Dr Daleen Squibb  in August of 2012 for atypical pain with no w/u No chest pain Has concerns with family history of CAD in brother and father    Calcium score 40 in September 2014  which was 90th percentile for age and sex matched controls   She has been trying both lipitor 20 mg and crestor 5 but only qod. The lipitor may cause heal pain. Repeat LDL on some statin Was 86 with HDL over 75   She has a lot of anxiety with multiple somatic complaints Dyspnea and palpitations Worried about her family history   ETT 12/14 was normal   Also concerned about aneurysm in right femoral area at previous cath sight  Duplex 02/03/14 was normal   ROS: Denies fever, malais, weight loss, blurry vision, decreased visual acuity, cough, sputum, SOB, hemoptysis, pleuritic pain, palpitaitons, heartburn, abdominal pain, melena, lower extremity edema, claudication, or rash.  All other systems reviewed and negative  General: Affect appropriate Healthy:  appears stated age HEENT: normal Neck supple with no adenopathy JVP normal no bruits no thyromegaly Lungs clear with no wheezing and good diaphragmatic motion Heart:  S1/S2 no murmur, no rub, gallop or click PMI normal Abdomen: benighn, BS positve, no tenderness, no AAA no bruit.  No HSM or HJR Distal pulses intact with no bruits  Reactive lymph node Right femoral area with prominent  pulse  No edema Neuro non-focal Skin warm and dry No muscular weakness   Current Outpatient Prescriptions  Medication Sig Dispense Refill  . levocetirizine (XYZAL) 5 MG tablet Take 5 mg by mouth as directed.     . meloxicam (MOBIC) 15 MG tablet Take 15 mg by mouth as needed for pain (Hip).    . rosuvastatin (CRESTOR) 5 MG tablet Take 1 tablet (5 mg total) by mouth daily. 90 tablet 3  . VENTOLIN HFA 108 (90 BASE) MCG/ACT inhaler Inhale 2 puffs into the lungs every 6 (six) hours as needed.     . zolpidem (AMBIEN) 10 MG tablet Take by mouth at bedtime.  4   Current Facility-Administered Medications  Medication Dose Route Frequency Provider Last Rate Last Dose  . triamcinolone acetonide (KENALOG-40) injection 40 mg  40 mg Other Once Alvan Dame, DPM        Allergies  Amoxicillin-pot clavulanate and Augmentin  Electrocardiogram:  2014  NSR normal ECG  01/31/14 NSR rate 87 normal and no change   Assessment and Plan Chest Pain: Cholesterol: Dysnpea:   [name]

## 2015-03-30 ENCOUNTER — Encounter: Payer: PRIVATE HEALTH INSURANCE | Admitting: Cardiovascular Disease

## 2015-05-01 NOTE — Progress Notes (Signed)
Patient ID: [name] [name], female   DOB: 08/29/1970, 45 y.o.   MRN: [id number]   [name] DresserConnie in the past for somewhat atypical chest pain. 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 I believe this was catheter induced. She had a cath back in 2001 by Dr. Antoine [name] and had spasm of the right coronary artery. Again, I think this was not clinical coronary spasm but simply related to catheter engagement. The patientsubsequently had chest pain back in 2007. She had a cardiac CT which showed no congenital abnormalities. Calcium score zero and normal coronary arteries.  Seen by Dr [name] [name]  in August of 2012 for atypical pain with no w/u No chest pain Has concerns with family history of CAD in brother and father    Calcium score 40 in September 2014  which was 90th percentile for age and sex matched controls   She has been trying both lipitor 20 mg and crestor 5 but only qod. The lipitor may cause heal pain. Repeat LDL on some statin Was 86 with HDL over 75  Has not taken any statin for 3 months as insurance wouldn't pay for crestor   She has a lot of anxiety with multiple somatic complaints Dyspnea and palpitations Worried about her family history  ETT 12/14 was normal   Also concerned about aneurysm in right femoral area at previous cath sight  However duplex done 01/2014 was normal with no pseudoaneurysm  ROS: Denies fever, malais, weight loss, blurry vision, decreased visual acuity, cough, sputum, SOB, hemoptysis, pleuritic pain, palpitaitons, heartburn, abdominal pain, melena, lower extremity edema, claudication, or rash.  All other systems reviewed and negative  General: Affect appropriate Healthy:  appears stated age HEENT: normal Neck supple with no adenopathy JVP normal no bruits no thyromegaly Lungs clear with no wheezing and good diaphragmatic motion Heart:  S1/S2 no murmur, no rub, gallop or click PMI normal Abdomen: benighn, BS positve, no tenderness, no AAA no bruit.  No  HSM or HJR Distal pulses intact with no bruits  Reactive lymph node Right femoral area with prominent pulse  No edema Neuro non-focal Skin warm and dry No muscular weakness   Current Outpatient Prescriptions  Medication Sig Dispense Refill  . levocetirizine (XYZAL) 5 MG tablet Take 5 mg by mouth as directed.     . meloxicam (MOBIC) 15 MG tablet Take 15 mg by mouth as needed for pain (Hip).    . VENTOLIN HFA 108 (90 BASE) MCG/ACT inhaler Inhale 2 puffs into the lungs every 6 (six) hours as needed for wheezing or shortness of breath.     . zolpidem (AMBIEN) 10 MG tablet Take 10 mg by mouth at bedtime.   4   Current Facility-Administered Medications  Medication Dose Route Frequency Provider Last Rate Last Dose  . triamcinolone acetonide (KENALOG-40) injection 40 mg  40 mg Other Once [name] [name], DPM        Allergies  Amoxicillin-pot clavulanate and Augmentin  Electrocardiogram:  2014  NSR normal ECG   01/31/14  NSR rate 87 normal and no change  05/08/15  SR rate 79  Normal   Assessment and Plan  CAD: f/u calcium score 11/2015 Cholesterol:  Called in zocor 10 mg generic repeat labs 3 months Anxiety chronic reassured her about her heart health    [name] [name]

## 2015-05-05 ENCOUNTER — Encounter: Payer: Self-pay | Admitting: Cardiovascular Disease

## 2015-05-08 ENCOUNTER — Ambulatory Visit (INDEPENDENT_AMBULATORY_CARE_PROVIDER_SITE_OTHER): Payer: PRIVATE HEALTH INSURANCE | Admitting: Cardiovascular Disease

## 2015-05-08 ENCOUNTER — Encounter: Payer: Self-pay | Admitting: Cardiovascular Disease

## 2015-05-08 VITALS — BP 140/80 | HR 68 | Ht 61.0 in | Wt 135.1 lb

## 2015-05-08 DIAGNOSIS — Z79899 Other long term (current) drug therapy: Secondary | ICD-10-CM

## 2015-05-08 DIAGNOSIS — Z09 Encounter for follow-up examination after completed treatment for conditions other than malignant neoplasm: Secondary | ICD-10-CM

## 2015-05-08 MED ORDER — SIMVASTATIN 10 MG PO TABS: 10.0000 mg | ORAL_TABLET | Freq: Every day | ORAL | Status: DC

## 2015-05-08 NOTE — Patient Instructions (Addendum)
Medication Instructions:  Your physician has recommended you make the following change in your medication:  1-START Zocor 10 mg by mouth daily   Labwork: Your physician recommends that you return for lab work in: 3 months with lipid and LFT   Testing/Procedures: Cardiac CT calcium score in October 2017  Follow-Up: Your physician wants you to follow-up in: 6 months with Dr. Eden EmmsNishan. You will receive a reminder letter in the mail two months in advance. If you don't receive a letter, please call our office to schedule the follow-up appointment.  If you need a refill on your cardiac medications before your next appointment, please call your pharmacy.

## 2015-08-07 ENCOUNTER — Other Ambulatory Visit: Payer: PRIVATE HEALTH INSURANCE

## 2015-11-27 ENCOUNTER — Ambulatory Visit (INDEPENDENT_AMBULATORY_CARE_PROVIDER_SITE_OTHER)
Admission: RE | Admit: 2015-11-27 | Discharge: 2015-11-27 | Disposition: A | Discharge status: Home / Self Care | Payer: BLUE CROSS/BLUE SHIELD | Source: Ambulatory Visit | Attending: Cardiovascular Disease | Admitting: Cardiovascular Disease

## 2015-11-27 ENCOUNTER — Other Ambulatory Visit: Payer: PRIVATE HEALTH INSURANCE | Admitting: *Deleted

## 2015-11-27 DIAGNOSIS — Z79899 Other long term (current) drug therapy: Secondary | ICD-10-CM

## 2015-11-27 DIAGNOSIS — Z09 Encounter for follow-up examination after completed treatment for conditions other than malignant neoplasm: Secondary | ICD-10-CM

## 2015-11-27 LAB — HEPATIC FUNCTION PANEL
ALBUMIN: 3.9 g/dL (ref 3.6–5.1)
ALK PHOS: 31 U/L — AB (ref 33–115)
ALT: 10 U/L (ref 6–29)
AST: 19 U/L (ref 10–35)
BILIRUBIN INDIRECT: 0.3 mg/dL (ref 0.2–1.2)
Bilirubin, Direct: 0.1 mg/dL (ref <=0.2)
TOTAL PROTEIN: 7.1 g/dL (ref 6.1–8.1)
Total Bilirubin: 0.4 mg/dL (ref 0.2–1.2)

## 2015-11-27 LAB — LIPID PANEL
CHOLESTEROL: 171 mg/dL (ref 125–200)
HDL: 68 mg/dL (ref >=46)
LDL CALC: 93 mg/dL (ref <130)
TRIGLYCERIDES: 49 mg/dL (ref <150)
Total CHOL/HDL Ratio: 2.5 Ratio (ref <=5.0)
VLDL: 10 mg/dL (ref <30)

## 2015-11-28 ENCOUNTER — Telehealth: Payer: Self-pay

## 2015-11-28 MED ORDER — SIMVASTATIN 20 MG PO TABS: 20.0000 mg | ORAL_TABLET | Freq: Every day | ORAL | 11 refills | Status: DC

## 2015-11-28 NOTE — Telephone Encounter (Signed)
Sent in order for Zocor 20 mg by mouth daily.  Patient aware of lab results.

## 2015-11-28 NOTE — Telephone Encounter (Signed)
-----   Message from Wendall StadePeter C Nishan, MD sent at 11/27/2015  5:13 PM EDT ----- Increase zocor to 20 mg try to get LDL under 75

## 2015-12-26 ENCOUNTER — Telehealth: Payer: Self-pay | Admitting: Cardiovascular Disease

## 2015-12-26 NOTE — Telephone Encounter (Signed)
[name] GangConnie Xiang is calling because she said she received a call that she is supposed to make an appointment as soon as possible and she is wanting to talk to Dr. Eden EmmsNishan about PTX therapy . Please call

## 2015-12-26 NOTE — Telephone Encounter (Signed)
[place] [facility]. Patient stated she had a Cardiac Angiogram done while she was there. Patient was recommended to have a plaque screening and PTX therapy. Informed patient that we have CT with Ca score test, and this might be the same thing as plaque screening. Informed patient that I am not aware of PTX therapy. Patient stated it is a procedure of 20 IV treatments that clears a patient's arteries. Encouraged patient to have her records fax to our office and I would forward this message to Dr. Eden EmmsNishan for further advisement.

## 2016-01-01 NOTE — Telephone Encounter (Signed)
Informed patient of Dr. Fabio BeringNishan's response. Patient stated she might just have the test done at [facility] in [place], and have the results sent to Dr. Eden EmmsNishan. Informed patient that she can have records faxed to our office with attention to Dr. Eden EmmsNishan. Patient verbalized understanding.

## 2016-01-01 NOTE — Telephone Encounter (Signed)
Her calcium score has increased and is in 99th percentile for her age I don't know what Rx she is contemplating But if she thinks it will help ok

## 2016-03-04 ENCOUNTER — Other Ambulatory Visit: Payer: Self-pay | Admitting: Obstetrics and Gynecology

## 2016-03-04 DIAGNOSIS — N644 Mastodynia: Secondary | ICD-10-CM

## 2016-03-08 ENCOUNTER — Other Ambulatory Visit: Payer: BLUE CROSS/BLUE SHIELD

## 2016-03-11 ENCOUNTER — Ambulatory Visit
Admission: RE | Admit: 2016-03-11 | Discharge: 2016-03-11 | Disposition: A | Discharge status: Home / Self Care | Payer: BLUE CROSS/BLUE SHIELD | Source: Ambulatory Visit | Attending: Obstetrics and Gynecology | Admitting: Obstetrics and Gynecology

## 2016-03-11 DIAGNOSIS — N644 Mastodynia: Secondary | ICD-10-CM

## 2016-03-18 NOTE — Progress Notes (Signed)
Patient ID: Rusty Aus, female   DOB: May 07, 1970, 46 y.o.   MRN: [id number]   Lluvia in the past for somewhat atypical chest pain. 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 I believe this was catheter induced. She had a cath back in 2001 by Dr. Antoine Poche and had spasm of the right coronary artery. Again, I think this was not clinical coronary spasm but simply related to catheter engagement. The patientsubsequently had chest pain back in 2007. She had a cardiac CT which showed no congenital abnormalities. Calcium score zero and normal coronary arteries.  Seen by Dr Daleen Squibb  in August of 2012 for atypical pain with no w/u No chest pain Has concerns with family history of CAD in brother and father    Calcium score 40 in September 2014  which was 90th percentile for age and sex matched controls  ETT same year normal  She has been trying both lipitor 20 mg and crestor 5 but only qod. The lipitor may cause heal pain. Repeat LDL on some statin Was 86 with HDL over 75     She has a lot of anxiety with multiple somatic complaints Dyspnea and palpitations Worried about her family history   Also concerned about aneurysm in right femoral area at previous cath sight  However duplex done 01/2014 was normal with no pseudoaneurysm  Reviewed notes from outside Cardiac CT 12/22/15  Calcium Score 146 95th percentile  Left dominant coronary arteries with no obstructive disease   ROS: Denies fever, malais, weight loss, blurry vision, decreased visual acuity, cough, sputum, SOB, hemoptysis, pleuritic pain, palpitaitons, heartburn, abdominal pain, melena, lower extremity edema, claudication, or rash.  All other systems reviewed and negative  General: Affect appropriate Healthy:  appears stated age HEENT: normal Neck supple with no adenopathy JVP normal no bruits no thyromegaly Lungs clear with no wheezing and good diaphragmatic motion Heart:  S1/S2 no murmur, no rub, gallop or click PMI  normal Abdomen: benighn, BS positve, no tenderness, no AAA no bruit.  No HSM or HJR Distal pulses intact with no bruits  Reactive lymph node Right femoral area with prominent pulse  No edema Neuro non-focal Skin warm and dry No muscular weakness   Current Outpatient Prescriptions  Medication Sig Dispense Refill  . Amino Acids (AMINO BALANCE PO) Take 1 scoop by mouth daily.    . Biotin 40981 MCG TABS Take 1 tablet by mouth every other day.    . Cholecalciferol (VITAMIN D3) 5000 units CAPS Take 1 capsule by mouth daily.    . Coenzyme Q10 200 MG capsule Take 200 mg by mouth daily.    . magnesium oxide (MAG-OX) 400 MG tablet Take 400 mg by mouth daily.    . Multiple Vitamin (MV-ONE PO) Take 1 tablet by mouth daily.    Marland Kitchen OVER THE COUNTER MEDICATION Take 1 scoop by mouth daily. DR L ARGENINE POWDER    . OVER THE COUNTER MEDICATION Take 0.5 mg by mouth daily. NATURAL THYROID    . VENTOLIN HFA 108 (90 BASE) MCG/ACT inhaler Inhale 2 puffs into the lungs every 6 (six) hours as needed for wheezing or shortness of breath.      Current Facility-Administered Medications  Medication Dose Route Frequency Provider Last Rate Last Dose  . triamcinolone acetonide (KENALOG-40) injection 40 mg  40 mg Other Once Alvan Dame, DPM        Allergies  Amoxicillin-pot clavulanate and Augmentin [amoxicillin-pot clavulanate]  Electrocardiogram:  2014  NSR normal  ECG   01/31/14  NSR rate 87 normal and no change  05/08/15  SR rate 79  Normal   Assessment and Plan  CAD: previous calcium score 40 now 146 start red yeast rice no obstructive disease on CTA Cholesterol:  Does not want statin concerned about side effects see above repeat labs 3 months  Anxiety chronic reassured her about her heart health  Vascular:  Interested in screening will refer to lab for carotid, AAA, ABI's    Charlton HawsPeter Nadiya Pieratt

## 2016-03-20 ENCOUNTER — Telehealth: Payer: Self-pay

## 2016-03-20 NOTE — Telephone Encounter (Signed)
SENT NOTE TO SCHEDULING 

## 2016-03-28 ENCOUNTER — Encounter: Payer: Self-pay | Admitting: Cardiovascular Disease

## 2016-03-28 ENCOUNTER — Ambulatory Visit (INDEPENDENT_AMBULATORY_CARE_PROVIDER_SITE_OTHER): Payer: BLUE CROSS/BLUE SHIELD | Admitting: Cardiovascular Disease

## 2016-03-28 VITALS — BP 110/70 | HR 70 | Ht 61.0 in | Wt 137.0 lb

## 2016-03-28 DIAGNOSIS — Z79899 Other long term (current) drug therapy: Secondary | ICD-10-CM

## 2016-03-28 DIAGNOSIS — I2510 Atherosclerotic heart disease of native coronary artery without angina pectoris: Secondary | ICD-10-CM | POA: Diagnosis not present

## 2016-03-28 NOTE — Patient Instructions (Addendum)
Medication Instructions:  Your physician recommends that you continue on your current medications as directed. Please refer to the Current Medication list given to you today.  Labwork: NONE  Testing/Procedures: Your physician has requested that you have a vascular screening. This test is an ultrasound of the arteries in your body. It looks at blood flow through these arteries that supply the body with blood. Allow one hour for this exam. There are no restrictions or special instructions.  Follow-Up: Your physician wants you to follow-up in: 6 months with Dr. Eden EmmsNishan. You will receive a reminder letter in the mail two months in advance. If you don't receive a letter, please call our office to schedule the follow-up appointment.   If you need a refill on your cardiac medications before your next appointment, please call your pharmacy.

## 2016-04-19 ENCOUNTER — Ambulatory Visit (HOSPITAL_COMMUNITY): Payer: BLUE CROSS/BLUE SHIELD

## 2016-05-09 ENCOUNTER — Ambulatory Visit (HOSPITAL_COMMUNITY)
Admission: RE | Admit: 2016-05-09 | Discharge: 2016-05-09 | Disposition: A | Discharge status: Home / Self Care | Payer: Self-pay | Source: Ambulatory Visit | Attending: Cardiology | Admitting: Cardiology

## 2016-05-09 DIAGNOSIS — Z0000 Encounter for general adult medical examination without abnormal findings: Secondary | ICD-10-CM

## 2016-05-09 DIAGNOSIS — Z79899 Other long term (current) drug therapy: Secondary | ICD-10-CM

## 2016-05-09 DIAGNOSIS — I2510 Atherosclerotic heart disease of native coronary artery without angina pectoris: Secondary | ICD-10-CM

## 2016-05-09 DIAGNOSIS — Z8249 Family history of ischemic heart disease and other diseases of the circulatory system: Secondary | ICD-10-CM

## 2016-07-15 ENCOUNTER — Other Ambulatory Visit: Payer: Self-pay | Admitting: Allergy and Immunology

## 2016-07-15 ENCOUNTER — Ambulatory Visit
Admission: RE | Admit: 2016-07-15 | Discharge: 2016-07-15 | Disposition: A | Discharge status: Home / Self Care | Payer: BLUE CROSS/BLUE SHIELD | Source: Ambulatory Visit | Attending: Allergy and Immunology | Admitting: Allergy and Immunology

## 2016-07-15 DIAGNOSIS — R059 Cough, unspecified: Secondary | ICD-10-CM

## 2016-07-15 DIAGNOSIS — R05 Cough: Secondary | ICD-10-CM

## 2016-08-05 ENCOUNTER — Telehealth: Payer: Self-pay | Admitting: Cardiovascular Disease

## 2016-08-05 NOTE — Telephone Encounter (Signed)
New message      Pt c/o BP issue: STAT if pt c/o blurred vision, one-sided weakness or slurred speech  1. What are your last 5 BP readings?  156/110 at doctors office today  2. Are you having any other symptoms (ex. Dizziness, headache, blurred vision, passed out)?  Headache, pt does not feel good, little sob 3. What is your BP issue?  Pt was at her doctors office today and her bp was high.  Pt is not on bp medication.  Please advise

## 2016-08-06 ENCOUNTER — Ambulatory Visit (INDEPENDENT_AMBULATORY_CARE_PROVIDER_SITE_OTHER): Payer: BLUE CROSS/BLUE SHIELD | Admitting: Cardiovascular Disease

## 2016-08-06 ENCOUNTER — Encounter: Payer: Self-pay | Admitting: Cardiovascular Disease

## 2016-08-06 ENCOUNTER — Encounter (INDEPENDENT_AMBULATORY_CARE_PROVIDER_SITE_OTHER): Payer: Self-pay

## 2016-08-06 VITALS — BP 136/90 | HR 72 | Ht 61.0 in | Wt 134.4 lb

## 2016-08-06 DIAGNOSIS — R0602 Shortness of breath: Secondary | ICD-10-CM | POA: Diagnosis not present

## 2016-08-06 DIAGNOSIS — I10 Essential (primary) hypertension: Secondary | ICD-10-CM | POA: Diagnosis not present

## 2016-08-06 DIAGNOSIS — R0789 Other chest pain: Secondary | ICD-10-CM

## 2016-08-06 MED ORDER — METOPROLOL SUCCINATE ER 25 MG PO TB24: 25.0000 mg | ORAL_TABLET | Freq: Every day | ORAL | 11 refills | Status: DC

## 2016-08-06 NOTE — Telephone Encounter (Signed)
Patient complaining of chest pain, SOB, and elevated BP. Patient stated she also has a headache and she is very stressed out. Informed Dr. Eden EmmsNishan of patient's symptoms, and he will see her today. Informed patient that Dr. Eden EmmsNishan will see her today. Patient verbalized understanding and will head on over to the office.

## 2016-08-06 NOTE — Telephone Encounter (Signed)
Follow Up:   Pt says her blood pressure is still up high.I was going to get the nurse,she is at the vet and she was about to have her pet seen.She told me to just have a nurse to call her please..Marland Kitchen

## 2016-08-06 NOTE — Patient Instructions (Addendum)
Medication Instructions:  Your physician has recommended you make the following change in your medication:  1-START Metoprolol 25 mg by mouth daily  Labwork: NONE  Testing/Procedures: Your physician has requested that you have an exercise tolerance test. For further information please visit [email or web address]. Please also follow instruction sheet, as given.  Follow-Up: Your physician wants you to follow-up in: 12 months with Dr. Eden EmmsNishan. You will receive a reminder letter in the mail two months in advance. If you don't receive a letter, please call our office to schedule the follow-up appointment.   If you need a refill on your cardiac medications before your next appointment, please call your pharmacy.

## 2016-08-06 NOTE — Progress Notes (Signed)
Patient ID: [name] [name], female   DOB: 10-21-1970, 46 y.o.   MRN: [id number]   Lindi in the past for somewhat atypical chest pain. 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 I believe this was catheter induced. She had a cath back in 2001 by Dr. Antoine Poche and had spasm of the right coronary artery. Again, I think this was not clinical coronary spasm but simply related to catheter engagement. The patientsubsequently had chest pain back in 2007. She had a cardiac CT which showed no congenital abnormalities. Calcium score zero and normal coronary arteries.  Seen by Dr Daleen Squibb  in August of 2012 for atypical pain with no w/u No chest pain Has concerns with family history of CAD in brother and father    Calcium score 40 in September 2014  which was 90th percentile for age and sex matched controls  ETT same year normal  She has been trying both lipitor 20 mg and crestor 5 but only qod. The lipitor may cause heal pain. Repeat LDL on some statin Was 86 with HDL over 75     She has a lot of anxiety with multiple somatic complaints Dyspnea and palpitations Worried about her family history    Reviewed notes from outside Cardiac CT 12/22/15  Calcium Score 146 95th percentile  Left dominant coronary arteries with no obstructive disease   Called office today worried about elevated BP has had migraines / headaches last week  Not relieved with excedrin / tylenol.  I explained to her that we could not image her coronary Arteries every 3-6 months due to radiation dose. SSCP atypical sharp fleeting right and left Sided To me clearly related to anxiety.    ROS: Denies fever, malais, weight loss, blurry vision, decreased visual acuity, cough, sputum, SOB, hemoptysis, pleuritic pain, palpitaitons, heartburn, abdominal pain, melena, lower extremity edema, claudication, or rash.  All other systems reviewed and negative  General: Affect appropriate Healthy:  appears stated age HEENT:  normal Neck supple with no adenopathy JVP normal no bruits no thyromegaly Lungs clear with no wheezing and good diaphragmatic motion Heart:  S1/S2 no murmur, no rub, gallop or click PMI normal Abdomen: benighn, BS positve, no tenderness, no AAA no bruit.  No HSM or HJR Distal pulses intact with no bruits  Reactive lymph node Right femoral area with prominent pulse  No edema Neuro non-focal Skin warm and dry No muscular weakness   Current Outpatient Prescriptions  Medication Sig Dispense Refill  . Amino Acids (AMINO BALANCE PO) Take 1 scoop by mouth daily.    . Biotin 40981 MCG TABS Take 1 tablet by mouth every other day.    . Cholecalciferol (VITAMIN D3) 5000 units CAPS Take 1 capsule by mouth daily.    . Coenzyme Q10 200 MG capsule Take 200 mg by mouth daily.    . magnesium oxide (MAG-OX) 400 MG tablet Take 400 mg by mouth daily.    . Multiple Vitamin (MV-ONE PO) Take 1 tablet by mouth daily.    Marland Kitchen OVER THE COUNTER MEDICATION Take 1 scoop by mouth daily. DR L ARGENINE POWDER    . OVER THE COUNTER MEDICATION Take 0.5 mg by mouth daily. NATURAL THYROID    . VENTOLIN HFA 108 (90 BASE) MCG/ACT inhaler Inhale 2 puffs into the lungs every 6 (six) hours as needed for wheezing or shortness of breath.     . metoprolol succinate (TOPROL XL) 25 MG 24 hr tablet Take 1 tablet (25 mg total)  by mouth daily. 30 tablet 11   Current Facility-Administered Medications  Medication Dose Route Frequency Provider Last Rate Last Dose  . 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

## 2016-09-03 ENCOUNTER — Ambulatory Visit (INDEPENDENT_AMBULATORY_CARE_PROVIDER_SITE_OTHER): Payer: BLUE CROSS/BLUE SHIELD

## 2016-09-03 DIAGNOSIS — I10 Essential (primary) hypertension: Secondary | ICD-10-CM

## 2016-09-03 DIAGNOSIS — R0602 Shortness of breath: Secondary | ICD-10-CM | POA: Diagnosis not present

## 2016-09-03 DIAGNOSIS — R0789 Other chest pain: Secondary | ICD-10-CM

## 2016-09-03 LAB — EXERCISE TOLERANCE TEST
CHL CUP RESTING HR STRESS: 88 {beats}/min
CHL RATE OF PERCEIVED EXERTION: 15
CSEPEDS: 0 s
CSEPEW: 13.4 METS
CSEPHR: 102 %
CSEPPHR: 179 {beats}/min
Exercise duration (min): 12 min
MPHR: 174 {beats}/min

## 2017-08-04 ENCOUNTER — Ambulatory Visit: Payer: Managed Care, Other (non HMO) | Admitting: Podiatry

## 2017-08-04 ENCOUNTER — Ambulatory Visit (INDEPENDENT_AMBULATORY_CARE_PROVIDER_SITE_OTHER): Payer: Managed Care, Other (non HMO)

## 2017-08-04 DIAGNOSIS — M779 Enthesopathy, unspecified: Secondary | ICD-10-CM | POA: Diagnosis not present

## 2017-08-04 DIAGNOSIS — M6740 Ganglion, unspecified site: Secondary | ICD-10-CM

## 2017-08-04 DIAGNOSIS — M722 Plantar fascial fibromatosis: Secondary | ICD-10-CM

## 2017-08-05 DIAGNOSIS — M722 Plantar fascial fibromatosis: Secondary | ICD-10-CM | POA: Insufficient documentation

## 2017-08-05 NOTE — Progress Notes (Signed)
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  She states that she has had some pain to the area starting to come back.  She said the injections typically do well for her and last for some time.  Denies any recent injury or trauma.  She also has secondary concerns that sometimes her toes will become tingly and painful which starts after she drinks craft beer only lasts for about an hour. Denies any systemic complaints such as fevers, chills, nausea, vomiting. No acute changes since last appointment, and no other complaints at this time.   Objective: AAO x3, NAD DP/PT pulses palpable bilaterally, CRT less than 3 seconds On the medial band of plantar fascia the left foot  >> right there is a firm nonmobile soft tissue mass on the left foot.  Mild tenderness palpation of the area.  There is no erythema or skin discoloration.  There is no pain in the toes today.  Mild HAV is present.  Over the first MPJ is where she gets discomfort while drinking craft beer No open lesions or pre-ulcerative lesions.  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  -X-rays were obtained and reviewed.  No evidence of acute fracture or foreign body or calcifications evident today.  HAV present -She was to proceed with a steroid injection from a plantar approach on the left foot.  See procedure note below.  I also ordered a verapamil cream from [facility] -Discussed pain to the big toe joints.  This is only with drinking certain alcohol and could be gout however unlikely given age. We will continue to monitor. -Patient encouraged to call the office with any questions, concerns, change in symptoms.   Procedure: Injection  Tendon/Ligament Discussed alternatives, risks, complications and verbal consent was obtained.  Location: Left plantar fibroma, plantar approach Skin Prep: Alcohol. Injectate: 0.5cc 0.5% marcaine plain, 0.5 cc 2% lidocaine plain and, 1 cc kenalog 10. Disposition: Patient tolerated procedure well. Injection site dressed with a band-aid.  Post-injection care was discussed and return precautions discussed.   Vivi BarrackMatthew R Wagoner DPM

## 2017-10-02 IMAGING — MG 2D DIGITAL DIAGNOSTIC BILATERAL MAMMOGRAM WITH CAD AND ADJUNCT T
8 of 12 series · 8 of 28 positions shown · non-contrast
Comparison: 06/20/2009

ACR Breast Density Category a: The breast tissue is almost entirely
fatty.

CLINICAL DATA: 45-year-old patient due for annual examination. She
feels focal nodularity in the 10 o'clock region of the right breast,
that feels asymmetric to the upper-outer quadrant of the left
breast, and she has pain in this region when pressure is applied,
for approximately the past 4 years.

EXAM:
DIGITAL DIAGNOSTIC BILATERAL MAMMOGRAM WITH CAD
ULTRASOUND RIGHT BREAST

[L CC synth-2D]
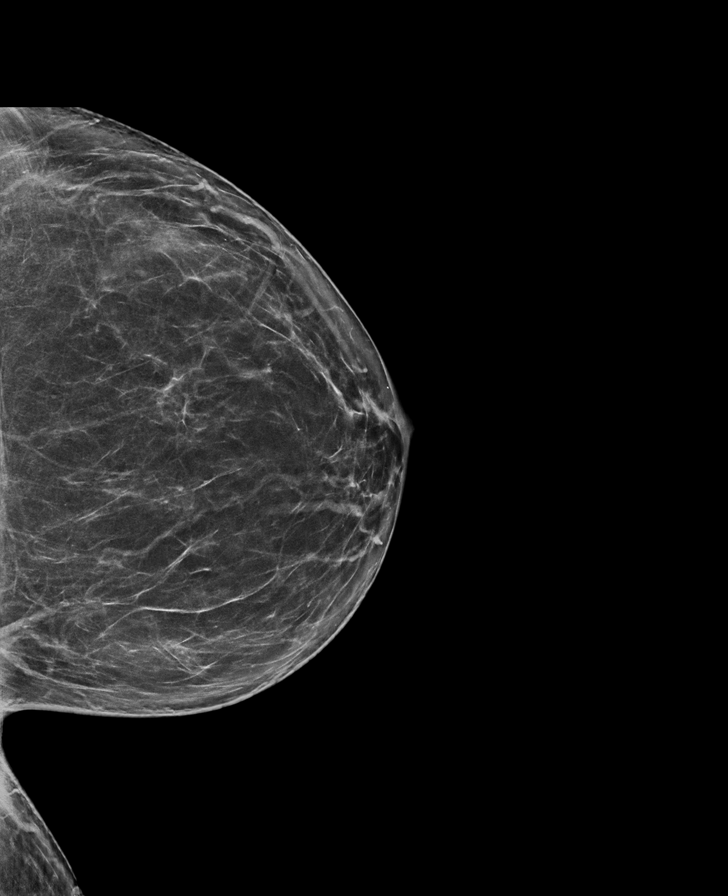

[R MLO synth-2D]
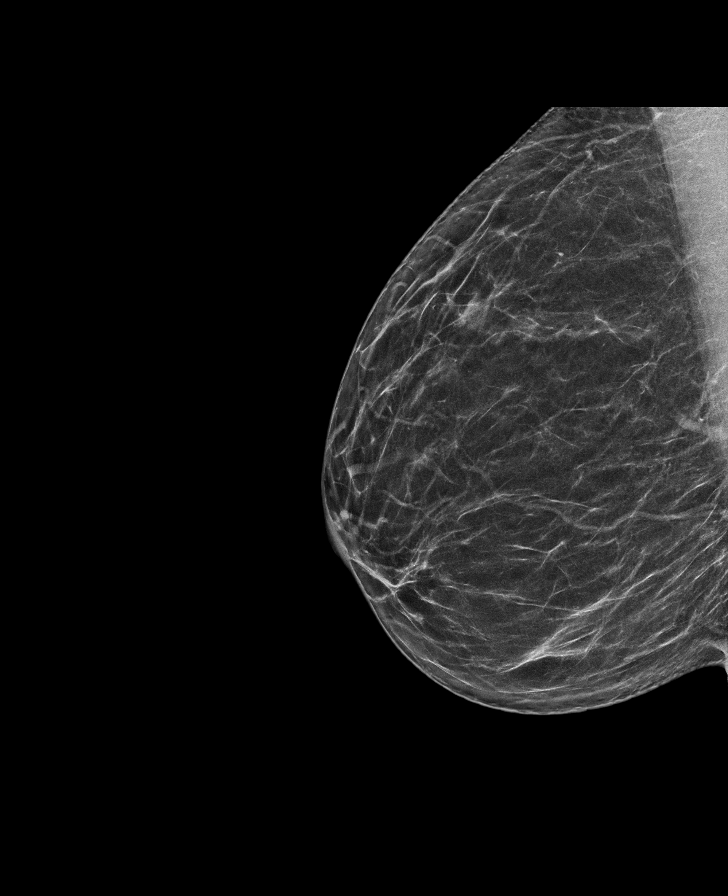

[L CC]
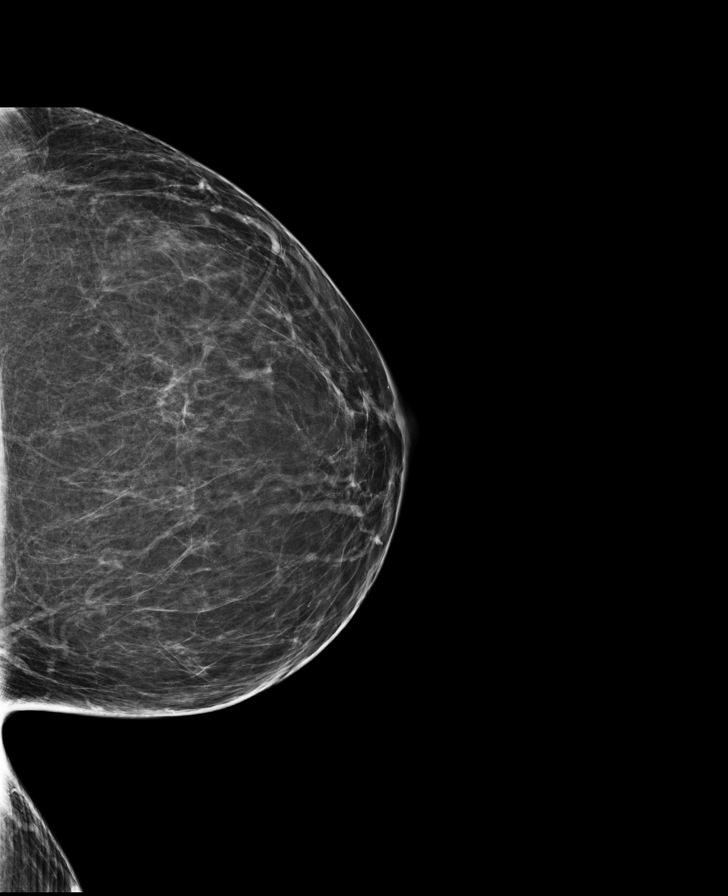

[L MLO]
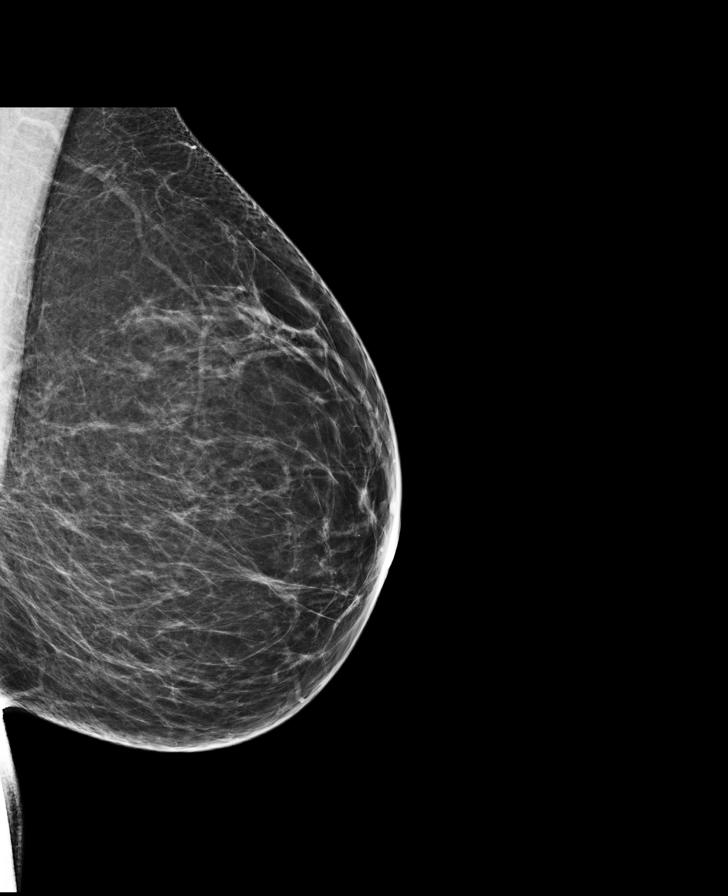

[R CC synth-2D]
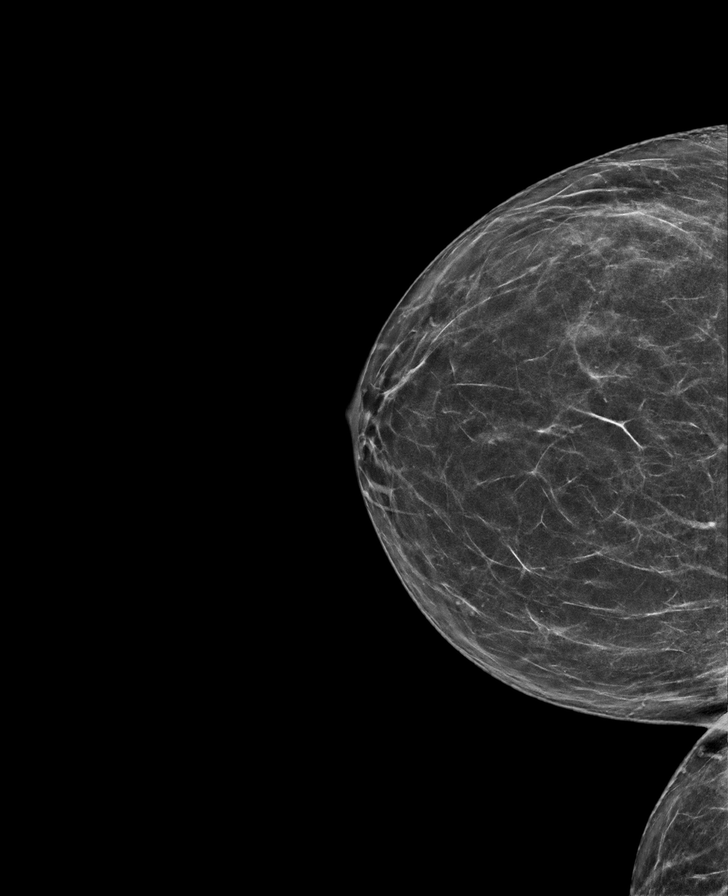

[R CC]
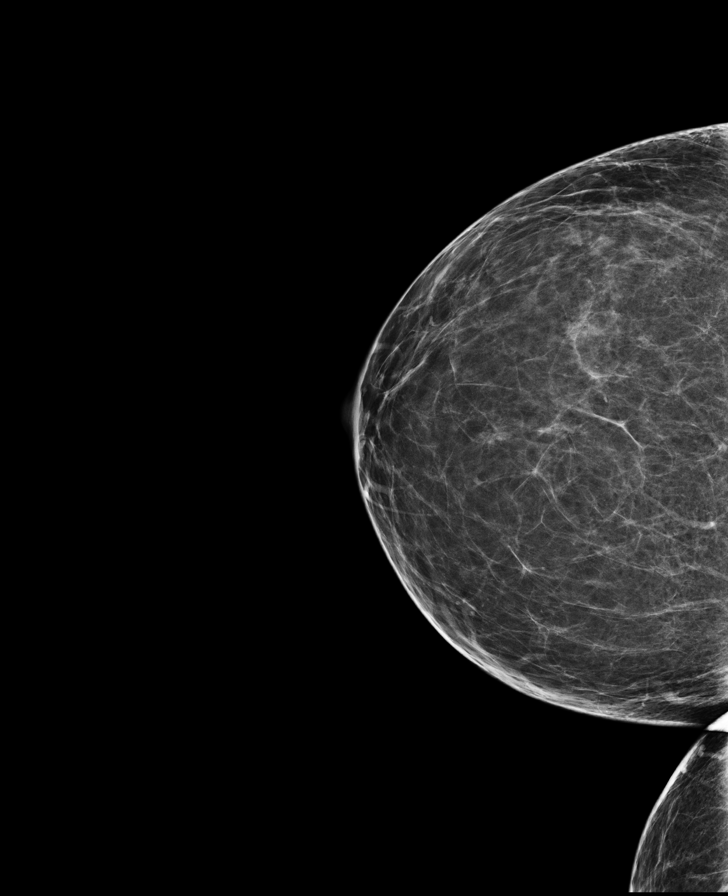

[L MLO synth-2D]
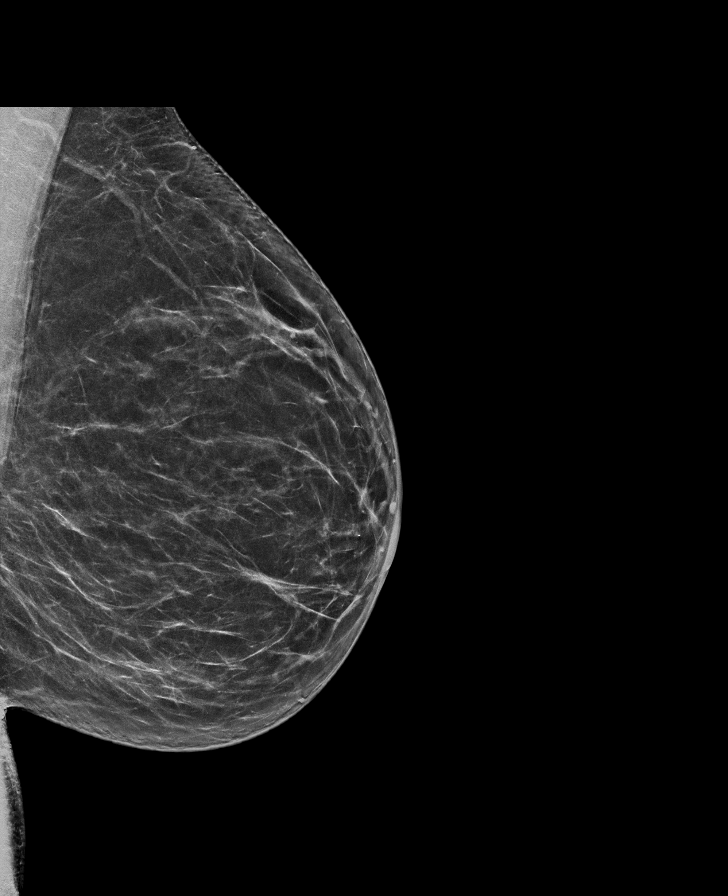

[R MLO]
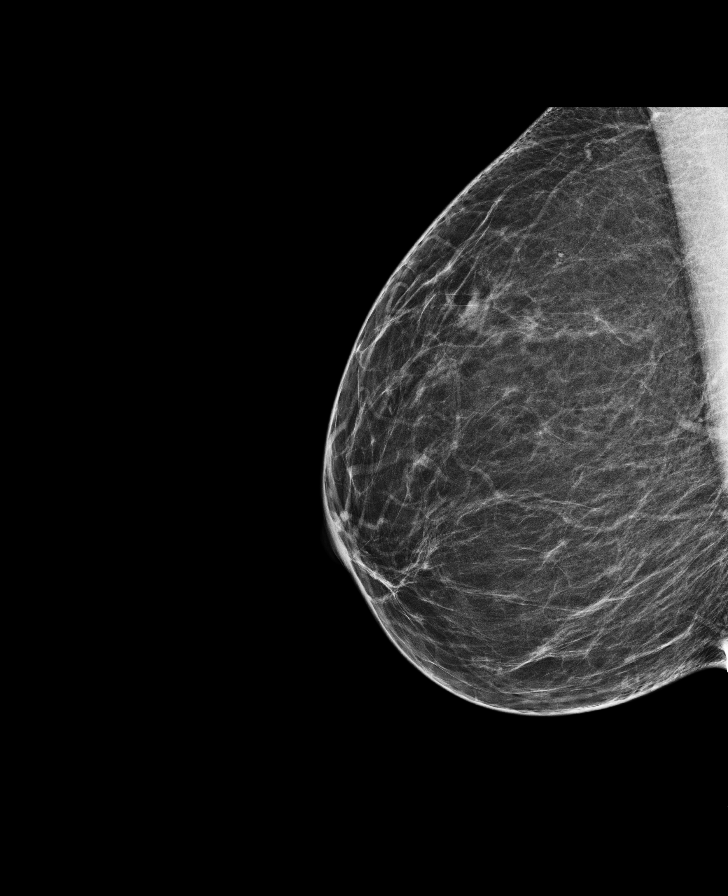

[8 of 28 positions shown; findings below may reference images not displayed]

FINDINGS: No mass, architectural distortion, or suspicious microcalcification
is identified in either breast to suggest malignancy.

Mammographic images were processed with CAD.

On physical exam, I palpate soft nodularity in the 10 o'clock region
of the right breast. No discrete or suspicious mass is palpated.

Targeted ultrasound is performed, showing normal fatty breast
parenchyma. No solid or cystic mass or abnormal shadowing is
identified.
IMPRESSION: No evidence of malignancy in either breast.

RECOMMENDATION:
Screening mammogram in one year.(Code:[id number]-CU0)

I have discussed the findings and recommendations with the patient.
Results were also provided in writing at the conclusion of the
visit. If applicable, a reminder letter will be sent to the patient
regarding the next appointment.

BI-RADS CATEGORY  1: Negative.

## 2018-08-05 ENCOUNTER — Ambulatory Visit (INDEPENDENT_AMBULATORY_CARE_PROVIDER_SITE_OTHER): Payer: Managed Care, Other (non HMO)

## 2018-08-05 ENCOUNTER — Ambulatory Visit: Payer: Managed Care, Other (non HMO) | Admitting: Podiatry

## 2018-08-05 ENCOUNTER — Other Ambulatory Visit: Payer: Self-pay | Admitting: Podiatry

## 2018-08-05 ENCOUNTER — Other Ambulatory Visit: Payer: Self-pay

## 2018-08-05 DIAGNOSIS — M722 Plantar fascial fibromatosis: Secondary | ICD-10-CM | POA: Diagnosis not present

## 2018-08-05 DIAGNOSIS — M79672 Pain in left foot: Secondary | ICD-10-CM | POA: Diagnosis not present

## 2018-08-05 DIAGNOSIS — T148XXA Other injury of unspecified body region, initial encounter: Secondary | ICD-10-CM

## 2018-08-05 DIAGNOSIS — I7300 Raynaud's syndrome without gangrene: Secondary | ICD-10-CM

## 2018-08-06 ENCOUNTER — Telehealth: Payer: Self-pay | Admitting: *Deleted

## 2018-08-06 DIAGNOSIS — M6740 Ganglion, unspecified site: Secondary | ICD-10-CM

## 2018-08-06 DIAGNOSIS — I7300 Raynaud's syndrome without gangrene: Secondary | ICD-10-CM

## 2018-08-06 DIAGNOSIS — T148XXA Other injury of unspecified body region, initial encounter: Secondary | ICD-10-CM

## 2018-08-06 DIAGNOSIS — M79672 Pain in left foot: Secondary | ICD-10-CM

## 2018-08-06 DIAGNOSIS — M722 Plantar fascial fibromatosis: Secondary | ICD-10-CM

## 2018-08-06 NOTE — Progress Notes (Signed)
Subjective: 48 year old female presents the office today for concerns of her left third toe becoming black and blue in color as well as occasionally losing sensation.  She states that she gets bruising, blood pooling and this goes from the second to the fourth metatarsal head as well on both the top and the bottom of her foot.  She states that she also gets pain to the area and after walking about 20 steps it feels better.  She has been wearing oofos which is been helpful.  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  She is not sure what is causing sensations.  She has a history of Raynauds however she states this feels completely different.  It does not make a difference it is not hot or cold.  This started over the last 1.5 months.  No recent injury. Denies any systemic complaints such as fevers, chills, nausea, vomiting. No acute changes since last appointment, and no other complaints at this time.   Objective: AAO x3, NAD DP/PT pulses palpable bilaterally, CRT less than 3 seconds There is a large plantar fibroma present on the arch of the foot on the left side.  To be extending more distally and has previously at the distal portion of the plantar fibromas where she does get discomfort she is also any discomfort into the second interspace.  There is some mild discoloration all the digits today with out any skin breakdown.  There is no specific area pinpoint bony tenderness.  No pain at the beginning to motion.  She did show me pictures today and appears to be on the second of the fourth metatarsal head and just proximal to this area as well extending into the toes both dorsally and plantarly. No open lesions or pre-ulcerative lesions.  No pain with calf compression, swelling, warmth, erythema  Assessment: Soft tissue mass, bruising  Plan: -All treatment options discussed with the patient including all alternatives, risks, complications.  -X-rays obtained reviewed.  No evidence of acute  fracture. -ED presentation.  I do think a part of that upon fibroma may be getting larger warm concern of possible cyst/rupture.  I am going to order an MRI of the left foot with and without contrast -For now continue with supportive shoes.  Discussed injection however on the MRI prior to this. -Patient encouraged to call the office with any questions, concerns, change in symptoms.   Trula Slade DPM

## 2018-08-06 NOTE — Telephone Encounter (Signed)
Orders to J. Quintana, RN for pre-cert, faxed to [facility]. 

## 2018-08-06 NOTE — Telephone Encounter (Signed)
-----   Message from Trula Slade, DPM sent at 08/05/2018  7:57 PM EDT ----- Can you please order an MRI of the left foot to evaluate soft tissue mass?

## 2018-08-20 ENCOUNTER — Telehealth: Payer: Self-pay

## 2018-08-20 NOTE — Telephone Encounter (Signed)
Patient called and wanted to talk about the cost of the MRI which was $1900.00 and was not able to do that and patient stated that she could not do this due to the amount and Per Dr Jacqualyn Posey could do a ultra sound or an injection and patient wanted to hold off on both and patient stated that it has only happened one other time and would like to wait and see how her foot does and if comes back will call and get injection. Lattie Haw

## 2018-08-20 NOTE — Telephone Encounter (Signed)
Patient called wanting to discuss her MRI she had done on her ankle.

## 2018-09-24 ENCOUNTER — Other Ambulatory Visit: Payer: Self-pay

## 2018-09-24 ENCOUNTER — Encounter: Payer: Self-pay | Admitting: Psychiatry

## 2018-09-24 ENCOUNTER — Ambulatory Visit (INDEPENDENT_AMBULATORY_CARE_PROVIDER_SITE_OTHER): Payer: 59 | Admitting: Psychiatry

## 2018-09-24 VITALS — BP 118/96 | HR 89

## 2018-09-24 DIAGNOSIS — F902 Attention-deficit hyperactivity disorder, combined type: Secondary | ICD-10-CM | POA: Diagnosis not present

## 2018-09-24 MED ORDER — METHYLPHENIDATE HCL 20 MG PO TABS: ORAL_TABLET | ORAL | 0 refills | Status: DC

## 2018-09-24 NOTE — Progress Notes (Signed)
[name] AusConnie S Bergerson [id number] 12/02/1970 48 y.o.  Subjective:   Patient ID:  [name] [name] is a 48 y.o. (DOB 04/06/1970) female.  Chief Complaint:  Chief Complaint  Patient presents with  . ADD    HPI [name] [name] presents to the office today for follow-up of ADD. She was last seen 09/02/16.  She was previously seen for treatment of attention deficit and prescribed Evekeo.  She reports that she no longer took of Evekeo once she had completed studying and passing certification exam. She was furloughed in March and was out through June. 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 She reports "it is so overwhelming" and has difficulty navigating new responsibilities with having to switch to multiple computer screens. Reports having 2 monitors and needing to keep 16 tabs open to perform her new job. She reports that she is typically taking a couple of hours at the start of her day trying to determine where to start. She reports that she took a Ritalin that she from a past script and was significantly more productivity for those 6 hours. Reports that she was better able to complete tasks the day she took Ritalin and felt less overwhelmed. She reports that the day she took Ritalin she noticed that her clothes were not picked up and different areas were cluttered. Reports that she prefers to jump between tasks and topics. Reports previous position allowed her more mobility and multi-tasking.   Reports that she likes to make sure everything is ready and packed. Denies checking behaviors or compulsions. Denies worry. Denies depressed mood. She reports adequate sleep. Reports that the last couple of nights she has slept over 8 hours a night. Reports energy and motivation have been good. She reports that she has a healthy appetite. She has been on a keto diet after gaining 12 lbs at start of pandemic. Reports that she has since lost 13  lbs. Denies SI.  Denies any impulsive or risky behavior. Denies excessive spending.   Started [employer] recently after not being able to exercise recently due to gyms being closed.   Past Psychiatric Medication Trials: Ritalin Ritalin [place] Glenetta BorgEvekeo Evekeo   Review of Systems:  Review of Systems  Cardiovascular: Negative for palpitations.  Gastrointestinal: Negative.   Musculoskeletal: Negative for gait problem.  Neurological: Negative for tremors.  Psychiatric/Behavioral:       Please refer to HPI    Medications: I have reviewed the patient's current medications.  Current Outpatient Medications  Medication Sig Dispense Refill  . Amino Acids (AMINO BALANCE PO) Take 1 scoop by mouth daily.    . Biotin 4098110000 MCG TABS Take 1 tablet by mouth every other day.    . Coenzyme Q10 200 MG capsule Take 200 mg by mouth daily.    . magnesium oxide (MAG-OX) 400 MG tablet Take 400 mg by mouth daily.    . Multiple Vitamin (MV-ONE PO) Take 1 tablet by mouth daily.    Marland Kitchen. NATURE-THROID 97.5 MG TABS     . NON FORMULARY [facility]  Scar Cream -  Verapamil 10%, Pentoxifylline 5% Apply 1-2 grams to affected area 3-4 times daily Qty. 120 gm 3 refills    . zolpidem (AMBIEN) 5 MG tablet TK 1 T PO QD HS    . Cholecalciferol (VITAMIN D3) 5000 units CAPS Take 1 capsule by mouth daily.    . clobetasol cream (TEMOVATE) 0.05 % clobetasol  0.05 % topical cream    . methylphenidate (RITALIN) 20 MG tablet Take 1/2-1 tab po BID 60 tablet 0  . [START ON 10/22/2018] methylphenidate (RITALIN) 20 MG tablet Take 1/2-1 tab po BID 60 tablet 0  . [START ON 11/19/2018] methylphenidate (RITALIN) 20 MG tablet Take 1/2-1 tab po BID 60 tablet 0  . metoprolol succinate (TOPROL XL) 25 MG 24 hr tablet Take 1 tablet (25 mg total) by mouth daily. (Patient taking differently: Take 25 mg by mouth daily. Reports taking prn) 30 tablet 11  . OVER THE COUNTER MEDICATION Take 0.5 mg by mouth daily. NATURAL THYROID    . predniSONE  (DELTASONE) 10 MG tablet prednisone 10 mg tablet    . VENTOLIN HFA 108 (90 BASE) MCG/ACT inhaler Inhale 2 puffs into the lungs every 6 (six) hours as needed for wheezing or shortness of breath.      Current Facility-Administered Medications  Medication Dose Route Frequency Provider Last Rate Last Dose  . triamcinolone acetonide (KENALOG-40) injection 40 mg  40 mg Other Once Alvan DameSikora, Richard, DPM        Medication Side Effects: Other: N/A  Allergies:  Allergies  Allergen Reactions  . Amoxicillin-Pot Clavulanate Rash    REACTION: rash  . Augmentin [Amoxicillin-Pot Clavulanate] Rash    Past Medical History:  Diagnosis Date  . Arthritis    Joint pain  . Blood transfusion   . Clotting disorder (HCC)    unknown  . Heart murmur     Family History  Problem Relation Age of Onset  . Diabetes Father   . Hyperlipidemia Father   . Sleep apnea Father   . Hypertension Brother   . Sleep apnea Other     Social History   Socioeconomic History  . Marital status: Married    Spouse name: Not on file  . Number of children: 1  . Years of education: Not on file  . Highest education level: Not on file  Occupational History    Employer: [employer]  Social Needs  . Financial resource strain: Not on file  . Food insecurity    Worry: Not on file    Inability: Not on file  . Transportation needs    Medical: Not on file    Non-medical: Not on file  Tobacco Use  . Smoking status: Never Smoker  . Smokeless tobacco: Never Used  Substance and Sexual Activity  . Alcohol use: No  . Drug use: No  . Sexual activity: Not on file  Lifestyle  . Physical activity    Days per week: Not on file    Minutes per session: Not on file  . Stress: Not on file  Relationships  . Social Musicianconnections    Talks on phone: Not on file    Gets together: Not on file    Attends religious service: Not on file    Active member of club or organization: Not on file    Attends meetings of clubs or  organizations: Not on file    Relationship status: Not on file  . Intimate partner violence    Fear of current or ex partner: Not on file    Emotionally abused: Not on file    Physically abused: Not on file    Forced sexual activity: Not on file  Other Topics Concern  . Not on file  Social History Narrative  . Not on file    Past Medical History, Surgical history, Social history, and Family history were reviewed and  updated as appropriate.   Please see review of systems for further details on the patient's review from today.   Objective:   Physical Exam:  BP (!) 118/96   Pulse 89   Physical Exam Constitutional:      General: She is not in acute distress.    Appearance: She is well-developed.  Musculoskeletal:        General: No deformity.  Neurological:     Mental Status: She is alert and oriented to person, place, and time.     Coordination: Coordination normal.  Psychiatric:        Attention and Perception: Perception normal. She is inattentive. She does not perceive auditory or visual hallucinations.        Mood and Affect: Mood is anxious. Mood is not depressed. Affect is not labile, blunt, angry or inappropriate.        Speech: Speech normal.        Behavior: Behavior normal.        Thought Content: Thought content normal. Thought content does not include homicidal or suicidal ideation. Thought content does not include homicidal or suicidal plan.        Cognition and Memory: Cognition and memory normal.        Judgment: Judgment normal.     Comments: Insight intact. No delusions.      Lab Review:     Component Value Date/Time   NA 139 09/17/2005 0924   K 4.3 09/17/2005 0924   CL 105 09/17/2005 0924   CO2 30 09/17/2005 0924   GLUCOSE 80 09/17/2005 0924   BUN 11 09/17/2005 0924   CREATININE 0.56 09/17/2005 0924   CALCIUM 9.1 09/17/2005 0924   PROT 7.1 11/27/2015 0814   ALBUMIN 3.9 11/27/2015 0814   AST 19 11/27/2015 0814   ALT 10 11/27/2015 0814    ALKPHOS 31 (L) 11/27/2015 0814   BILITOT 0.4 11/27/2015 0814       Component Value Date/Time   WBC 5.5 09/17/2005 0924   RBC 3.87 09/17/2005 0924   HGB 11.9 (L) 02/06/2007 0841   HGB 11.7 09/17/2005 0924   HCT 34.6 (L) 09/17/2005 0924   PLT 276 09/17/2005 0924   MCV 89.5 09/17/2005 0924   MCH 30.3 09/17/2005 0924   MCHC 33.9 09/17/2005 0924   RDW 14.2 09/17/2005 0924   LYMPHSABS 1.9 09/17/2005 0924   MONOABS 0.6 09/17/2005 0924   EOSABS 0.2 09/17/2005 0924   BASOSABS 0.0 09/17/2005 0924    No results found for: POCLITH, LITHIUM   No results found for: PHENYTOIN, PHENOBARB, VALPROATE, CBMZ   .res Assessment: Plan:   Patient seen for 45 minutes and greater than 50% of visit spent counseling patient and coordination of care to include reviewing medical record and reevaluation since patient has not been seen in over 2 years.  Will restart methylphenidate 20 mg 1/2-1 tab p.o. twice daily for attention deficit since patient reports that ADD signs and symptoms are interfering with her ability to complete current job responsibilities.  She reports that she may consider stopping methylphenidate and or taking only as needed once she is able to resume to previous job role.  Discussed potential benefits, risks, and side effects of methylphenidate. Discussed potential benefits, risks, and side effects of stimulants with patient to include increased heart rate, palpitations, insomnia, increased anxiety, increased irritability, or decreased appetite.  Instructed patient to contact office if experiencing any significant tolerability issues. Patient to follow-up in 3 months or sooner if clinically indicated. Patient  advised to contact office with any questions, adverse effects, or acute worsening in signs and symptoms.  Rache was seen today for add.  Diagnoses and all orders for this visit:  Attention deficit hyperactivity disorder (ADHD), combined type -     methylphenidate (RITALIN) 20 MG  tablet; Take 1/2-1 tab po BID -     methylphenidate (RITALIN) 20 MG tablet; Take 1/2-1 tab po BID -     methylphenidate (RITALIN) 20 MG tablet; Take 1/2-1 tab po BID     Please see After Visit Summary for patient specific instructions.  Future Appointments  Date Time Provider [place]  12/25/2018  8:45 AM Thayer Headings, PMHNP CP-CP None    No orders of the defined types were placed in this encounter.   -------------------------------

## 2018-10-21 ENCOUNTER — Ambulatory Visit: Payer: 59 | Admitting: Psychiatry

## 2018-12-25 ENCOUNTER — Ambulatory Visit: Payer: 59 | Admitting: Psychiatry

## 2019-10-15 ENCOUNTER — Other Ambulatory Visit: Payer: Self-pay

## 2019-10-15 ENCOUNTER — Ambulatory Visit (HOSPITAL_COMMUNITY)
Admission: EM | Admit: 2019-10-15 | Discharge: 2019-10-15 | Disposition: A | Discharge status: Home / Self Care | Payer: Managed Care, Other (non HMO) | Attending: Family Medicine | Admitting: Family Medicine

## 2019-10-15 DIAGNOSIS — M79601 Pain in right arm: Secondary | ICD-10-CM | POA: Diagnosis not present

## 2019-10-15 DIAGNOSIS — M545 Low back pain, unspecified: Secondary | ICD-10-CM

## 2019-10-15 MED ORDER — NAPROXEN 500 MG PO TABS: 500.0000 mg | ORAL_TABLET | Freq: Two times a day (BID) | ORAL | 0 refills | Status: DC

## 2019-10-15 MED ORDER — CYCLOBENZAPRINE HCL 5 MG PO TABS: 5.0000 mg | ORAL_TABLET | Freq: Two times a day (BID) | ORAL | 0 refills | Status: DC | PRN
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
## 2019-10-15 NOTE — Discharge Instructions (Signed)
Naprosyn twice daily as needed for pain and inflammation You may use flexeril as needed to help with pain. This is a muscle relaxer and causes sedation- please use only at bedtime or when you will be home and not have to drive/work Alternate ice and heat Follow up if not improving or worsening
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
## 2019-12-13 ENCOUNTER — Encounter: Payer: Self-pay | Admitting: Cardiology

## 2019-12-13 ENCOUNTER — Ambulatory Visit (INDEPENDENT_AMBULATORY_CARE_PROVIDER_SITE_OTHER): Payer: Managed Care, Other (non HMO) | Admitting: Cardiology

## 2019-12-13 ENCOUNTER — Other Ambulatory Visit: Payer: Self-pay

## 2019-12-13 VITALS — BP 132/88 | HR 110 | Ht 61.0 in | Wt 133.8 lb

## 2019-12-13 DIAGNOSIS — I2510 Atherosclerotic heart disease of native coronary artery without angina pectoris: Secondary | ICD-10-CM | POA: Diagnosis not present

## 2019-12-13 DIAGNOSIS — E7800 Pure hypercholesterolemia, unspecified: Secondary | ICD-10-CM | POA: Diagnosis not present

## 2019-12-13 DIAGNOSIS — Z7189 Other specified counseling: Secondary | ICD-10-CM

## 2019-12-13 DIAGNOSIS — R030 Elevated blood-pressure reading, without diagnosis of hypertension: Secondary | ICD-10-CM

## 2019-12-13 DIAGNOSIS — Z79899 Other long term (current) drug therapy: Secondary | ICD-10-CM | POA: Diagnosis not present

## 2019-12-13 DIAGNOSIS — Z8249 Family history of ischemic heart disease and other diseases of the circulatory system: Secondary | ICD-10-CM

## 2019-12-13 MED ORDER — PRAVASTATIN SODIUM 20 MG PO TABS: 10.0000 mg | ORAL_TABLET | ORAL | 3 refills | Status: DC

## 2019-12-13 NOTE — Patient Instructions (Addendum)
Medication Instructions:  Start Pravastatin 10 mg every other day  *If you need a refill on your cardiac medications before your next appointment, please call your pharmacy*   Lab Work: Your physician recommends that you return for lab work in 2 months ( Fasting lipid, LFT)  If you have labs (blood work) drawn today and your tests are completely normal, you will receive your results only by: Marland Kitchen MyChart Message (if you have MyChart) OR . A paper copy in the mail If you have any lab test that is abnormal or we need to change your treatment, we will call you to review the results.   Testing/Procedures: None ordered    Follow-Up: At [facility], you and your health needs are our priority.  As part of our continuing mission to provide you with exceptional heart care, we have created designated Provider Care Teams.  These Care Teams include your primary Cardiologist (physician) and Advanced Practice Providers (APPs -  Physician Assistants and Nurse Practitioners) who all work together to provide you with the care you need, when you need it.  We recommend signing up for the patient portal called "MyChart".  Sign up information is provided on this After Visit Summary.  MyChart is used to connect with patients for Virtual Visits (Telemedicine).  Patients are able to view lab/test results, encounter notes, upcoming appointments, etc.  Non-urgent messages can be sent to your provider as well.   To learn more about what you can do with MyChart, go to [email or web address].    Your next appointment:   2 month(s)  The format for your next appointment:   In Person  Provider:   Jodelle Red, MD   how to check blood pressure:  -sit comfortably in a chair, feet uncrossed and flat on floor, for 5-10 minutes  -arm ideally should rest at the level of the heart. However, arm should be relaxed and not tense (for example, do not hold the arm up unsupported)  -avoid exercise, caffeine, and  tobacco for at least 30 minutes prior to BP reading  -don't take BP cuff reading over clothes (always place on skin directly)  -I prefer to know how well the medication is working, so I would like you to take your readings 1-2 hours after taking your blood pressure medication if possible

## 2019-12-13 NOTE — Progress Notes (Signed)
Cardiology Office Note:    Date:  12/13/2019   ID:  [name] [name], DOB 06/24/1970, MRN [id number]  PCP:   Dr. Laurine BlazerWalters, in [place] Cardiologist:  Jodelle RedBridgette Vernor Monnig, MD  CC: new patient evaluation for calcium and cardiovascular risk  History of Present Illness:    [name] [name] is a 49 y.o. female with a hx of chest pain who is seen as a new patient for the evaluation and management of vascular calcified plaque and CV risk. She has not been seen since 07/2016 by Dr. Eden EmmsNishan.  I reviewed her prior cardiac history. She has had an extensive workup for her chest pain. 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 Marga MelnickSam Walters). Had plaque screening ultrasound in 2019, noted calcified nonobstructive plaque in the right carotid, left carotid, abdominal aorta, right CFA, left CFA, and bilateral popliteal artery.  Father had open heart surgery at age 49, died age 49. Brother died at 3348 of MI. Had high blood pressure as well.   She is very concerned about her overall risk. Spent extensive time today discussing pathophysiology of atherosclerosis, vascular calcium. Discussed scientific literature on statins and guideline recommendations.  Does crossfit 3 days/week. Eats very healthy.  Has tried to avoid medications for her cholesterol. Did PTX chelation therapy, told that she does not need to be on cholesterol therapy. Has had genetic testing, told she has a predisposition to clogged arteries.  Had a headache last week, BP 150s. Took metoprolol 25 mg x2, BP improved to 110s systolic after this. Last dose two days ago.   Denies chest pain, shortness of breath at rest or with normal exertion. No PND, orthopnea, LE edema or unexpected  weight gain. No syncope or palpitations.  Reviewed cholesterol labs from 06/22/2019 in [facility]: Tchol 219 HDL 86 LDL 134 TG 64  A1c 4.9 04/2018 per [facility].   Past Medical History:  Diagnosis Date  . Arthritis    Joint pain  . Blood transfusion   . Clotting disorder (HCC)    unknown  . Heart murmur     Past Surgical History:  Procedure Laterality Date  . CARDIAC CATHETERIZATION     groin 2001  . CERVICAL BIOPSY    . ECTOPIC PREGNANCY SURGERY    . NASAL SEPTUM SURGERY    . TONSILLECTOMY      Current Medications: Current Outpatient Medications on File Prior to Visit  Medication Sig  . Amino Acids (AMINO BALANCE PO) Take 1 scoop by mouth daily.  . Biotin 4098110000 MCG TABS Take 1 tablet by mouth every other day.  . Cholecalciferol (VITAMIN D3) 5000 units CAPS Take 1 capsule by mouth daily.  . clobetasol cream (TEMOVATE) 0.05 % clobetasol 0.05 % topical cream  . Coenzyme Q10 200 MG capsule Take 200 mg by mouth daily.  . cyclobenzaprine (FLEXERIL) 5 MG tablet Take 1-2 tablets (5-10 mg total) by mouth 2 (two) times daily as needed for muscle spasms.  . magnesium oxide (MAG-OX) 400 MG tablet Take 400 mg by mouth daily.  . metoprolol succinate (TOPROL XL) 25 MG 24 hr tablet Take 1 tablet (25 mg total) by mouth daily. (Patient taking differently: Take 25 mg by mouth daily. Reports taking prn)  .  Multiple Vitamin (MV-ONE PO) Take 1 tablet by mouth daily.  . naproxen (NAPROSYN) 500 MG tablet Take 1 tablet (500 mg total) by mouth 2 (two) times daily.  [facility]  Scar Cream -  Verapamil 10%, Pentoxifylline 5% Apply 1-2 grams to affected area 3-4 times daily Qty. 120 gm 3 refills  . VENTOLIN HFA 108 (90 BASE) MCG/ACT inhaler Inhale 2 puffs into the lungs every 6 (six) hours as needed for wheezing or shortness of breath.   . zolpidem (AMBIEN) 5 MG tablet TK 1 T PO QD HS   Current Facility-Administered Medications on File Prior to Visit  Medication  . triamcinolone  acetonide (KENALOG-40) injection 40 mg     Allergies:   Amoxicillin-pot clavulanate and Augmentin [amoxicillin-pot clavulanate]   Social History   Tobacco Use  . Smoking status: Never Smoker  . Smokeless tobacco: Never Used  Substance Use Topics  . Alcohol use: No  . Drug use: No    Family History: family history includes Diabetes in her father; Hyperlipidemia in her father; Hypertension in her brother; Sleep apnea in her father and another family member.  ROS:   Please see the history of present illness.  Additional pertinent ROS: Constitutional: Negative for chills, fever, unintentional weight loss. HENT: Negative for ear pain and hearing loss.   Eyes: Negative for loss of vision and eye pain.  Respiratory: Negative for cough, sputum, wheezing.   Cardiovascular: See HPI. Gastrointestinal: Negative for abdominal pain, melena, and hematochezia.  Genitourinary: Negative for dysuria and hematuria.  Musculoskeletal: Negative for falls and myalgias.  Skin: Negative for itching and rash.  Neurological: Negative for focal weakness, focal sensory changes and loss of consciousness.  Endo/Heme/Allergies: Does not bruise/bleed easily.     EKGs/Labs/Other Studies Reviewed:    The following studies were reviewed today: Summarized in HPI  EKG:  EKG is personally reviewed.  The ekg ordered today demonstrates sinus tachycardia at 110 bpm.  Recent Labs: No results found for requested labs within last 8760 hours.  Recent Lipid Panel    Component Value Date/Time   CHOL 171 11/27/2015 0814   TRIG 49 11/27/2015 0814   HDL 68 11/27/2015 0814   CHOLHDL 2.5 11/27/2015 0814   VLDL 10 11/27/2015 0814   LDLCALC 93 11/27/2015 0814    Physical Exam:    VS:  BP 132/88   Pulse (!) 110   Ht 5\' 1"  (1.549 m)   Wt 133 lb 12.8 oz (60.7 kg)   SpO2 97%   BMI 25.28 kg/m     Wt Readings from Last 3 Encounters:  12/13/19 133 lb 12.8 oz (60.7 kg)  08/06/16 134 lb 6.4 oz (61 kg)  03/28/16 137  lb (62.1 kg)    GEN: Well nourished, well developed in no acute distress HEENT: Normal, moist mucous membranes NECK: No JVD CARDIAC: regular rhythm, normal S1 and S2, no rubs or gallops. No murmurs. VASCULAR: Radial and DP pulses 2+ bilaterally. No carotid bruits RESPIRATORY:  Clear to auscultation without rales, wheezing or rhonchi  ABDOMEN: Soft, non-tender, non-distended MUSCULOSKELETAL:  Ambulates independently SKIN: Warm and dry, no edema NEUROLOGIC:  Alert and oriented x 3. No focal neuro deficits noted. PSYCHIATRIC:  Normal affect    ASSESSMENT:    1. Coronary artery calcification seen on CT scan   2. Hypercholesterolemia   3. Medication management   4. Elevated blood pressure reading   5. Cardiac risk counseling   6. Counseling on health promotion and disease prevention  7. Family history of heart disease    PLAN:    Coronary calcium, calcified vascular plaque seen on imaging Hypercholesterolemia, family history of heart disease: -discussed pathology of cholesterol, how plaques form, that MI/CVA result commonly from acute plaque rupture and not gradual stenosis. Discussed mechanism of statin to both decrease plaque accumulation and stabilize plaque that is already present. Discussed that calcium is a marker for plaque, with decades of validated data regarding average amounts of calcium for age/gender/ethnicity, as well as value of calcium score for risk stratification -though her ASCVD risk score is low, the presence of vascular calcium supports known plaque. We reviewed the guideline recommendations for this are to discuss aspirin and statin.  -we discussed the data on statins, both in terms of their long term benefit as well as the risk of side effects. Reviewed common misconceptions about statins. Reviewed how we monitor treatment. After shared decision making, patient is agreeable to trialing statin. -has been on rosuvastatin, atorvastatin, and simvastatin in the past.  Cannot recall any specific issues with rosuvastatin and atorvastatin, but did have myalgia with increase in simvastatin from 10 to 20 mg daily. -will start with low dose pravastatin every other day. Repeat lipids/LFTS in 2 mos  Elevated blood pressure reading: at the time of a headache -we discussed pattern at length. Given rapid response to low dose metoprolol, I suspect this was sympathetically triggered -has had a great deal of stress in her life currently -I would like to have her monitor home blood pressures. If consistently elevated, I would recommend a blood pressure medication aimed at hypertension (beta blockers are not first line). If it seems to be more situational, I would focus on the triggers rather than overtreating.   Cardiac risk counseling and prevention recommendations: -recommend heart healthy/Mediterranean diet, with whole grains, fruits, vegetable, fish, lean meats, nuts, and olive oil. Limit salt. -recommend moderate walking, 3-5 times/week for 30-50 minutes each session. Aim for at least 150 minutes.week. Goal should be pace of 3 miles/hours, or walking 1.5 miles in 30 minutes -recommend avoidance of tobacco products. Avoid excess alcohol.  Plan for follow up: 2 mos  Total time of encounter: 77 minutes total time of encounter, including 57 minutes spent in face-to-face patient care. This time includes coordination of care and counseling regarding cardiovascular disease, plaque physiology, guidelines, data, and recommendations for management. Remainder of non-face-to-face time involved reviewing chart documents/testing relevant to the patient encounter and documentation in the medical record.  Jodelle Red, MD, PhD [place]  [facility]   Medication Adjustments/Labs and Tests Ordered: Current medicines are reviewed at length with the patient today.  Concerns regarding medicines are outlined above.  Orders Placed This Encounter  Procedures  . Lipid panel   . Hepatic function panel  . EKG 12-Lead   Meds ordered this encounter  Medications  . pravastatin (PRAVACHOL) 20 MG tablet    Sig: Take 0.5 tablets (10 mg total) by mouth every other day.    Dispense:  20 tablet    Refill:  3    Patient Instructions  Medication Instructions:  Start Pravastatin 10 mg every other day  *If you need a refill on your cardiac medications before your next appointment, please call your pharmacy*   Lab Work: Your physician recommends that you return for lab work in 2 months ( Fasting lipid, LFT)  If you have labs (blood work) drawn today and your tests are completely normal, you will receive your results only by: Marland Kitchen MyChart  Message (if you have MyChart) OR . A paper copy in the mail If you have any lab test that is abnormal or we need to change your treatment, we will call you to review the results.   Testing/Procedures: None ordered    Follow-Up: At [facility] you and your health needs are our priority.  As part of our continuing mission to provide you with exceptional heart care, we have created designated Provider Care Teams.  These Care Teams include your primary Cardiologist (physician) and Advanced Practice Providers (APPs -  Physician Assistants and Nurse Practitioners) who all work together to provide you with the care you need, when you need it.  We recommend signing up for the patient portal called "MyChart".  Sign up information is provided on this After Visit Summary.  MyChart is used to connect with patients for Virtual Visits (Telemedicine).  Patients are able to view lab/test results, encounter notes, upcoming appointments, etc.  Non-urgent messages can be sent to your provider as well.   To learn more about what you can do with MyChart, go to [email or web address].    Your next appointment:   2 month(s)  The format for your next appointment:   In Person  Provider:   Jodelle Red, MD   how to check blood  pressure:  -sit comfortably in a chair, feet uncrossed and flat on floor, for 5-10 minutes  -arm ideally should rest at the level of the heart. However, arm should be relaxed and not tense (for example, do not hold the arm up unsupported)  -avoid exercise, caffeine, and tobacco for at least 30 minutes prior to BP reading  -don't take BP cuff reading over clothes (always place on skin directly)  -I prefer to know how well the medication is working, so I would like you to take your readings 1-2 hours after taking your blood pressure medication if possible   Signed, Jodelle Red, MD PhD 12/13/2019 4:08 PM    [facility]

## 2020-02-09 ENCOUNTER — Ambulatory Visit: Payer: Managed Care, Other (non HMO) | Admitting: Cardiology

## 2020-11-02 ENCOUNTER — Ambulatory Visit: Payer: Self-pay | Admitting: Podiatry

## 2021-04-19 ENCOUNTER — Encounter: Payer: Self-pay | Admitting: *Deleted

## 2021-04-23 ENCOUNTER — Encounter: Payer: Self-pay | Admitting: Neurology

## 2021-04-23 ENCOUNTER — Ambulatory Visit (INDEPENDENT_AMBULATORY_CARE_PROVIDER_SITE_OTHER): Payer: Managed Care, Other (non HMO) | Admitting: Neurology

## 2021-04-23 VITALS — BP 134/85 | HR 73 | Ht 61.0 in | Wt 137.0 lb

## 2021-04-23 DIAGNOSIS — H539 Unspecified visual disturbance: Secondary | ICD-10-CM | POA: Diagnosis not present

## 2021-04-23 DIAGNOSIS — Z8249 Family history of ischemic heart disease and other diseases of the circulatory system: Secondary | ICD-10-CM

## 2021-04-23 DIAGNOSIS — H02402 Unspecified ptosis of left eyelid: Secondary | ICD-10-CM

## 2021-04-23 DIAGNOSIS — G4484 Primary exertional headache: Secondary | ICD-10-CM | POA: Diagnosis not present

## 2021-04-23 DIAGNOSIS — R51 Headache with orthostatic component, not elsewhere classified: Secondary | ICD-10-CM

## 2021-04-23 DIAGNOSIS — R519 Headache, unspecified: Secondary | ICD-10-CM

## 2021-04-23 DIAGNOSIS — H93A9 Pulsatile tinnitus, unspecified ear: Secondary | ICD-10-CM

## 2021-04-23 DIAGNOSIS — G43101 Migraine with aura, not intractable, with status migrainosus: Secondary | ICD-10-CM | POA: Diagnosis not present

## 2021-04-23 MED ORDER — NURTEC 75 MG PO TBDP: 75.0000 mg | ORAL_TABLET | Freq: Every day | ORAL | 0 refills | Status: AC | PRN

## 2021-04-23 NOTE — Patient Instructions (Addendum)
MRI of the brain and blood vessels If negative can treat acutely with a triptan like Rizatriptan(see packet) and today can give samples of nurtec or can start a preventative Nurtec: once daily as needed for migraine  There is increased risk for stroke in women with migraine with aura and a contraindication for the combined contraceptive pill for use by women who have migraine with aura. The risk for women with migraine without aura is lower. However other risk factors like smoking are far more likely to increase stroke risk than migraine. There is a recommendation for no smoking and for the use of OCPs without estrogen such as progestogen only pills particularly for women with migraine with aura.Marland Kitchen People who have migraine headaches with auras may be 3 times more likely to have a stroke caused by a blood clot, compared to migraine patients who don't see auras. Women who take hormone-replacement therapy may be 30 percent more likely to suffer a clot-based stroke than women not taking medication containing estrogen. Other risk factors like smoking and high blood pressure may be  much more important.  Rimegepant oral dissolving tablet What is this medication? RIMEGEPANT (ri ME je pant) is used to treat migraine headaches with or without aura. An aura is a strange feeling or visual disturbance that warns you of an attack. It is also used to prevent migraine headaches. This medicine may be used for other purposes; ask your health care provider or pharmacist if you have questions. COMMON BRAND NAME(S): NURTEC ODT What should I tell my care team before I take this medication? They need to know if you have any of these conditions: kidney disease liver disease an unusual or allergic reaction to rimegepant, other medicines, foods, dyes, or preservatives pregnant or trying to get pregnant breast-feeding How should I use this medication? Take the medicine by mouth. Follow the directions on the prescription  label. Leave the tablet in the sealed blister pack until you are ready to take it. With dry hands, open the blister and gently remove the tablet. If the tablet breaks or crumbles, throw it away and take a new tablet out of the blister pack. Place the tablet in the mouth and allow it to dissolve, and then swallow. Do not cut, crush, or chew this medicine. You do not need water to take this medicine. Talk to your pediatrician about the use of this medicine in children. Special care may be needed. Overdosage: If you think you have taken too much of this medicine contact a poison control center or emergency room at once. NOTE: This medicine is only for you. Do not share this medicine with others. What if I miss a dose? This does not apply. This medicine is not for regular use. What may interact with this medication? This medicine may interact with the following medications: certain medicines for fungal infections like fluconazole, itraconazole rifampin This list may not describe all possible interactions. Give your health care provider a list of all the medicines, herbs, non-prescription drugs, or dietary supplements you use. Also tell them if you smoke, drink alcohol, or use illegal drugs. Some items may interact with your medicine. What should I watch for while using this medication? Visit your health care professional for regular checks on your progress. Tell your health care professional if your symptoms do not start to get better or if they get worse. What side effects may I notice from receiving this medication? Side effects that you should report to your doctor or health  care professional as soon as possible: allergic reactions like skin rash, itching or hives; swelling of the face, lips, or tongue Side effects that usually do not require medical attention (report these to your doctor or health care professional if they continue or are bothersome): nausea This list may not describe all possible  side effects. Call your doctor for medical advice about side effects. You may report side effects to FDA at 1-800-FDA-1088. Where should I keep my medication? Keep out of the reach of children and pets. Store at room temperature between 20 and 25 degrees C (68 and 77 degrees F). Get rid of any unused medicine after the expiration date. To get rid of medicines that are no longer needed or have expired: Take the medicine to a medicine take-back program. Check with your pharmacy or law enforcement to find a location. If you cannot return the medicine, check the label or package insert to see if the medicine should be thrown out in the garbage or flushed down the toilet. If you are not sure, ask your health care provider. If it is safe to put it in the trash, take the medicine out of the container. Mix the medicine with cat litter, dirt, coffee grounds, or other unwanted substance. Seal the mixture in a bag or container. Put it in the trash. NOTE: This sheet is a summary. It may not cover all possible information. If you have questions about this medicine, talk to your doctor, pharmacist, or health care provider.  2022 Elsevier/Gold Standard (2019-07-27 00:00:00)  Rizatriptan Disintegrating Tablets What is this medication? RIZATRIPTAN (rye za TRIP tan) treats migraines. It works by blocking pain signals and narrowing blood vessels in the brain. It belongs to a group of medications called triptans. It is not used to prevent migraines. This medicine may be used for other purposes; ask your health care provider or pharmacist if you have questions. COMMON BRAND NAME(S): Maxalt-MLT What should I tell my care team before I take this medication? They need to know if you have any of these conditions: Cigarette smoker Circulation problems in fingers and toes Diabetes Heart disease High blood pressure High cholesterol History of irregular heartbeat History of stroke Kidney disease Liver disease Stomach  or intestine problems An unusual or allergic reaction to rizatriptan, other medications, foods, dyes, or preservatives Pregnant or trying to get pregnant Breast-feeding How should I use this medication? Take this medication by mouth. Follow the directions on the prescription label. Leave the tablet in the sealed blister pack until you are ready to take it. With dry hands, open the blister and gently remove the tablet. If the tablet breaks or crumbles, throw it away and take a new tablet out of the blister pack. Place the tablet in the mouth and allow it to dissolve, and then swallow. Do not cut, crush, or chew this medication. You do not need water to take this medication. Do not take it more often than directed. Talk to your care team regarding the use of this medication in children. While this medication may be prescribed for children as young as 6 years for selected conditions, precautions do apply. Overdosage: If you think you have taken too much of this medicine contact a poison control center or emergency room at once. NOTE: This medicine is only for you. Do not share this medicine with others. What if I miss a dose? This does not apply. This medication is not for regular use. What may interact with this medication? Do not  take this medication with any of the following medications: Certain medications for migraine headache like almotriptan, eletriptan, frovatriptan, naratriptan, rizatriptan, sumatriptan, zolmitriptan Ergot alkaloids like dihydroergotamine, ergonovine, ergotamine, methylergonovine MAOIs like Carbex, Eldepryl, Marplan, Nardil, and Parnate This medication may also interact with the following medications: Certain medications for depression, anxiety, or psychotic disorders Propranolol This list may not describe all possible interactions. Give your health care provider a list of all the medicines, herbs, non-prescription drugs, or dietary supplements you use. Also tell them if you  smoke, drink alcohol, or use illegal drugs. Some items may interact with your medicine. What should I watch for while using this medication? Visit your care team for regular checks on your progress. Tell your care team if your symptoms do not start to get better or if they get worse. You may get drowsy or dizzy. Do not drive, use machinery, or do anything that needs mental alertness until you know how this medication affects you. Do not stand up or sit up quickly, especially if you are an older patient. This reduces the risk of dizzy or fainting spells. Alcohol may interfere with the effect of this medication. Your mouth may get dry. Chewing sugarless gum or sucking hard candy and drinking plenty of water may help. Contact your care team if the problem does not go away or is severe. If you take migraine medications for 10 or more days a month, your migraines may get worse. Keep a diary of headache days and medication use. Contact your care team if your migraine attacks occur more frequently. What side effects may I notice from receiving this medication? Side effects that you should report to your care team as soon as possible: Allergic reactions--skin rash, itching, hives, swelling of the face, lips, tongue, or throat Burning, pain, tingling, or color changes in the legs or feet Heart attack--pain or tightness in the chest, shoulders, arms, or jaw, nausea, shortness of breath, cold or clammy skin, feeling faint or lightheaded Heart rhythm changes--fast or irregular heartbeat, dizziness, feeling faint or lightheaded, chest pain, trouble breathing Increase in blood pressure Irritability, confusion, fast or irregular heartbeat, muscle stiffness, twitching muscles, sweating, high fever, seizure, chills, vomiting, diarrhea, which may be signs of serotonin syndrome Raynaud's--cool, numb, or painful fingers or toes that may change color from pale, to blue, to red Seizures Stroke--sudden numbness or weakness  of the face, arm, or leg, trouble speaking, confusion, trouble walking, loss of balance or coordination, dizziness, severe headache, change in vision Sudden or severe stomach pain, nausea, vomiting, fever, or bloody diarrhea Vision loss Side effects that usually do not require medical attention (report to your care team if they continue or are bothersome): Dizziness General discomfort or fatigue This list may not describe all possible side effects. Call your doctor for medical advice about side effects. You may report side effects to FDA at 1-800-FDA-1088. Where should I keep my medication? Keep out of the reach of children and pets. Store at room temperature between 15 and 30 degrees C (59 and 86 degrees F). Protect from light and moisture. Throw away any unused medication after the expiration date. NOTE: This sheet is a summary. It may not cover all possible information. If you have questions about this medicine, talk to your doctor, pharmacist, or health care provider.  2022 Elsevier/Gold Standard (2020-03-22 00:00:00)

## 2021-04-23 NOTE — Progress Notes (Signed)
[facility]    Provider:  Dr Lucia Gaskins Requesting Provider: Harold Hedge, MD Primary Care Provider:  Bary Leriche, MD  CC:  headache  HPI:  [name] [name] is a 51 y.o. female here as requested by Harold Hedge, MD for headache.  For the last 3 years she has been having headaches with her periods. She would take excedrin migraine and would help right at onset. She had some migraines in her 64s it went away. She has her last period 02/08/2021, her labs indicate she is menopausal, she had a severe headache, she lost periphera vision bilaterally, severe nausea, light and sound sensitivity, worst headache of her life, lasted for 4 days, was in bed, severe, pulsating/pounding and throbbing, dazed, confused, felt aphasic, would go to say something and couldn't articulate it, then she felt a cold burning or a drop on her head, then felt it again, started the 16th of this month. This past Saturday she started having another headaches, felt drops and splatters inside on the left side of the skull, nothing was there, dazed confused, severe pain, severe immediately, thunderclap, she was in the car and got sick, severe headache. She has rolled over in bed and felt a pop behind her nose and pressure relieved and happens 3x a month but a different type of headache more holocephalic in the middle of the night. Both sides of the family had aneurysms, her headaches can be exertional. Maternal aunt has 2 brain aneurysms and fathers side sister and grandmother with abdominal, thoracic aneurysms. She has pulsating hearing in the ear. Her vision has gotten weaker on the left eye. Hasn't seen an eye doctor. In her 70s she had some aura.  She is having hot flashes. No other focal neurologic deficits, associated symptoms, inciting events or modifiable factors.   Review of Systems: Patient complains of symptoms per HPI as well as the following symptoms headaches. Pertinent negatives and  positives per HPI. All others negative.   Social History   Socioeconomic History   Marital status: Married    Spouse name: Not on file   Number of children: 1   Years of education: Not on file   Highest education level: Not on file  Occupational History    Employer: [employer]  Tobacco Use   Smoking status: Never   Smokeless tobacco: Never  Substance and Sexual Activity   Alcohol use: Not Currently    Comment: occasionally, none in a long time   Drug use: No   Sexual activity: Not on file  Other Topics Concern   Not on file  Social History Narrative   Lives at home with husband   Right handed   Caffeine: 1 cup/day   Social Determinants of Health   Financial Resource Strain: Not on file  Food Insecurity: Not on file  Transportation Needs: Not on file  Physical Activity: Not on file  Stress: Not on file  Social Connections: Not on file  Intimate Partner Violence: Not on file    Family History  Problem Relation Age of Onset   Diabetes Father    Hyperlipidemia Father    Sleep apnea Father    Hypertension Brother    Aortic aneurysm Paternal Grandmother    Sleep apnea Other    AAA (abdominal aortic aneurysm) Paternal Aunt    Cerebral aneurysm Maternal Aunt     Past Medical History:  Diagnosis Date   Arthritis    Joint pain;  pt denies any known diagnosis of  arthritis   Blood transfusion    Clotting disorder (HCC)    unknown   Heart murmur    Hypothyroidism     Patient Active Problem List   Diagnosis Date Noted   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     Biotin 67619 MCG TABS Take 1 tablet by mouth every other day.     Coenzyme Q10 200 MG capsule Take 200 mg by mouth daily.     lidocaine (XYLOCAINE) 2 % jelly lidocaine HCl 2 % mucosal jelly  PLACE 1 GRAM OF TOPICAL EVERY DAY APPLY SMALL AMOUNT TO URETHRAL MEATUS PRIOR TO SELF CATHETERIZATION     magnesium oxide (MAG-OX) 400 MG tablet Take 400 mg by mouth daily.     Multiple Vitamin (MV-ONE PO) Take 1 tablet by mouth daily.     Rimegepant Sulfate (NURTEC) 75 MG TBDP Take 75 mg by mouth daily as needed. For migraines. Take as close to onset of migraine as possible. One daily maximum. 8 tablet 0   traMADol (ULTRAM) 50 MG tablet tramadol 50 mg tablet  Take 1 tablet every 6 hours by oral route as needed.     VENTOLIN HFA 108 (90 BASE) MCG/ACT inhaler Inhale 2 puffs into the lungs every 6 (six) hours as needed for wheezing or shortness of breath.      zolpidem (AMBIEN) 5 MG tablet TK 1 T PO QD HS     Current Facility-Administered Medications  Medication Dose Route Frequency Provider Last Rate Last Admin   triamcinolone acetonide (KENALOG-40) injection 40 mg  40 mg Other Once Alvan Dame, DPM        Allergies as of 04/23/2021 - Review Complete 04/23/2021  Allergen Reaction Noted   Amoxicillin-pot clavulanate Rash    Augmentin [amoxicillin-pot clavulanate] Rash 08/06/2010    Vitals: BP 134/85 (BP Location: Left Arm, Patient Position: Sitting)    Pulse 73    Ht 5\' 1"  (1.549 m)    Wt 137 lb (62.1 kg)    BMI 25.89 kg/m  Last Weight:  Wt Readings from Last 1 Encounters:  04/23/21 137 lb (62.1 kg)   Last Height:   Ht Readings from Last 1 Encounters:  04/23/21 5\' 1"  (1.549 m)     Physical exam: Exam: Gen: NAD, conversant, well nourised, well groomed                     CV: RRR, no MRG. No Carotid Bruits. No peripheral edema, warm, nontender Eyes: Conjunctivae clear without exudates or  hemorrhage  Neuro: Detailed Neurologic Exam  Speech:    Speech is normal; fluent and spontaneous with normal comprehension.  Cognition:    The patient is oriented to person, place, and time;     recent and remote memory intact;     language fluent;     normal attention, concentration,     fund of knowledge Cranial Nerves:  The pupils are equal, round, and reactive to light. The fundi are normal and spontaneous venous pulsations are present. Visual fields are full to finger confrontation. Extraocular movements are intact. Trigeminal sensation is intact and the muscles of mastication are normal. slight ptosis left  otherwise face is symmetric. The palate elevates in the midline. Hearing intact. Voice is normal. Shoulder shrug is normal. The tongue has normal motion without fasciculations.   Coordination:    Normal   Gait:   normal.   Motor Observation:    No asymmetry, no atrophy, and no involuntary movements noted. Tone:    Normal muscle tone.    Posture:    Posture is normal. normal erect    Strength:    Strength is V/V in the upper and lower limbs.      Sensation: intact to LT     Reflex Exam:  DTR's:    Deep tendon reflexes in the upper and lower extremities are normal bilaterally.   Toes:    The toes are downgoing bilaterally.   Clonus:    Clonus is absent.    Assessment/Plan:  Patient with worsening migraines likely due to fluctuation of hormones during perimenopause however given concerning symptoms and FHx of cerebral aneurysm needs thorough evaluation:  MRI brain due to concerning symptoms of morning headaches, positional and exertional headaches,vision changes, worsening headaches  to look for space occupying mass, chiari or intracranial hypertension (pseudotumor), strokes, malignancies, vasculidities, demyelination(multiple sclerosis) or other  MRA: Exertional headaches and pulsatile tinnitus with FHx of cerebral aneurysm need MRA  MRI of the brain and  blood vessels If negative can treat acutely with a triptan like Rizatriptan(see packet) and today can give samples of nurtec or can start a preventative Nurtec: once daily as needed for migraine  Orders Placed This Encounter  Procedures   MR BRAIN W WO CONTRAST   MR ANGIO HEAD WO CONTRAST   CBC with Differential/Platelets   Comprehensive metabolic panel   TSH   Meds ordered this encounter  Medications   Rimegepant Sulfate (NURTEC) 75 MG TBDP    Sig: Take 75 mg by mouth daily as needed. For migraines. Take as close to onset of migraine as possible. One daily maximum.    Dispense:  8 tablet    Refill:  0    Cc: Harold Hedge, MD,  Bary Leriche, MD  Naomie Dean, MD  [facility] [address] [place], [place] [address]  Phone [phone] Fax [phone]  I spent over 45 minutes of face-to-face and non-face-to-face time with patient on the  1. Migraine with aura and with status migrainosus, not intractable   2. Exertional headache   3. Worsening headaches   4. Worst headache of life   5. Vision changes   6. Pulsatile tinnitus   7. Positional headache   8. Ptosis of left eyelid   9. FHx: cerebral aneurysm    diagnosis.  This included previsit chart review, lab review, study review, order entry, electronic health record documentation, patient education on the different diagnostic and therapeutic options, counseling and coordination of care, risks and benefits of management, compliance, or risk factor reduction

## 2021-04-26 ENCOUNTER — Telehealth: Payer: Self-pay | Admitting: Neurology

## 2021-04-26 NOTE — Telephone Encounter (Signed)
LVM for pt to call back to schedule  ?Novella RobSA:[id number] (exp. 04/26/21 to 10/23/21)  ?

## 2021-04-26 NOTE — Telephone Encounter (Signed)
Patient rreturned my call she is scheduled at [facility] for 05/29/21 ?

## 2021-05-29 ENCOUNTER — Ambulatory Visit (INDEPENDENT_AMBULATORY_CARE_PROVIDER_SITE_OTHER): Payer: Managed Care, Other (non HMO)

## 2021-05-29 DIAGNOSIS — H539 Unspecified visual disturbance: Secondary | ICD-10-CM

## 2021-05-29 DIAGNOSIS — G4484 Primary exertional headache: Secondary | ICD-10-CM | POA: Diagnosis not present

## 2021-05-29 DIAGNOSIS — R519 Headache, unspecified: Secondary | ICD-10-CM

## 2021-05-29 DIAGNOSIS — R51 Headache with orthostatic component, not elsewhere classified: Secondary | ICD-10-CM

## 2021-05-29 DIAGNOSIS — Z8249 Family history of ischemic heart disease and other diseases of the circulatory system: Secondary | ICD-10-CM

## 2021-05-29 DIAGNOSIS — H93A9 Pulsatile tinnitus, unspecified ear: Secondary | ICD-10-CM

## 2021-05-29 DIAGNOSIS — H02402 Unspecified ptosis of left eyelid: Secondary | ICD-10-CM

## 2021-05-29 MED ORDER — GADOBENATE DIMEGLUMINE 529 MG/ML IV SOLN
10.0000 mL | Freq: Once | INTRAVENOUS | Status: AC | PRN
  Administered 2021-05-29: 10 mL via INTRAVENOUS

## 2021-05-31 ENCOUNTER — Telehealth: Payer: Self-pay | Admitting: Neurology

## 2021-05-31 NOTE — Telephone Encounter (Signed)
LVM and sent mychart message informing pt of appointment change- MD out 4/24. ?

## 2021-06-04 ENCOUNTER — Other Ambulatory Visit: Payer: Self-pay | Admitting: Neurology

## 2021-06-04 ENCOUNTER — Telehealth: Payer: Self-pay | Admitting: Neurology

## 2021-06-04 DIAGNOSIS — I671 Cerebral aneurysm, nonruptured: Secondary | ICD-10-CM

## 2021-06-04 NOTE — Telephone Encounter (Signed)
Referrals sent to [facility] [phone]. ?

## 2021-06-05 ENCOUNTER — Telehealth: Payer: Self-pay | Admitting: *Deleted

## 2021-06-05 ENCOUNTER — Telehealth: Payer: Self-pay | Admitting: Neurology

## 2021-06-05 ENCOUNTER — Ambulatory Visit
Admission: RE | Admit: 2021-06-05 | Discharge: 2021-06-05 | Disposition: A | Discharge status: Home / Self Care | Payer: Managed Care, Other (non HMO) | Source: Ambulatory Visit | Attending: Neurology | Admitting: Neurology

## 2021-06-05 DIAGNOSIS — I671 Cerebral aneurysm, nonruptured: Secondary | ICD-10-CM

## 2021-06-05 MED ORDER — IOPAMIDOL (ISOVUE-370) INJECTION 76%
75.0000 mL | Freq: Once | INTRAVENOUS | Status: AC | PRN
  Administered 2021-06-05: 75 mL via INTRAVENOUS

## 2021-06-05 NOTE — Telephone Encounter (Signed)
Spoke with patient and discussed that report is unclear but there may be a pituitary mass or an aneurysm. Advised pt that Dr Lucia Gaskins placed an order for CTA to further characterize. Pt stated she had already been called and scheduled CT-A for today. Also referral is being sent to Dr Conchita Paris at [facility]. Pt's questions were answered. She will await a call from [facility] to schedule. Pt verbalized appreciation for the call.  ?

## 2021-06-05 NOTE — Telephone Encounter (Signed)
Pt is requesting a call concerning her ct-scan results and if she will get them today or not.  ?

## 2021-06-05 NOTE — Telephone Encounter (Addendum)
It is unclear by report, but there may be a pituitary mass or an aneurysm.  I will be ordering CTA of the head to further characterize.  Please call and discussed with patient.  I am also going to send a referral to Dr. Conchita Paris at [facility] neurosurgery for evaluation.  If patient has a severe headache or new symptoms she should go to the emergency room immediately.  ?Written by Anson Fret, MD on 06/04/2021  4:22 PM EDT ? ?----- Message from Anson Fret, MD sent at 06/04/2021  4:23 PM EDT ----- ?MRI of the brain appears normal, however please see my notes about MRI and call patient. ?

## 2021-06-05 NOTE — Telephone Encounter (Signed)
error 

## 2021-06-05 NOTE — Telephone Encounter (Signed)
CT Angio head 06/05/2021 results available on Epic. Have been discussed with Dr Epimenio Foot since Dr Lucia Gaskins is out of the office. Patient was notified via telephone that today's CT angio head showed a right sphenoid sinus retention cyst corresponding to questioned abnormality on the prior study. Cyst not large enough to need ENT eval and should not cause her headaches. There is no pituitary macroadenoma or aneurysm showing on this study. The patient was very appreciative for the good news and will await follow-up from Dr Lucia Gaskins upon her return to the office. ? 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 As was apparent on that study, there ?is no pituitary macroadenoma or aneurysm. ?

## 2021-06-05 NOTE — Telephone Encounter (Signed)
Rutherford Nail: [id number] (exp. 06/05/21 to 12/02/21) order sent to GI, I also sent Rinaldo Cloud and Pieter Partridge a message to reach out to the patient as soon as possible to schedule.  ? ?

## 2021-06-06 NOTE — Addendum Note (Signed)
Addended by: Guy Begin on: 06/06/2021 07:14 AM ? ? Modules accepted: Orders ? ?

## 2021-06-06 NOTE — Telephone Encounter (Signed)
I cancelled the neurosurgery consult as per Dr. Lucia Gaskins order (as pt does not need).   ?

## 2021-06-06 NOTE — Telephone Encounter (Signed)
Noted, thank you

## 2021-06-18 ENCOUNTER — Telehealth: Payer: Managed Care, Other (non HMO) | Admitting: Neurology

## 2021-06-25 ENCOUNTER — Telehealth: Payer: Self-pay | Admitting: *Deleted

## 2021-06-25 ENCOUNTER — Telehealth (INDEPENDENT_AMBULATORY_CARE_PROVIDER_SITE_OTHER): Payer: Managed Care, Other (non HMO) | Admitting: Neurology

## 2021-06-25 DIAGNOSIS — G43709 Chronic migraine without aura, not intractable, without status migrainosus: Secondary | ICD-10-CM

## 2021-06-25 MED ORDER — AJOVY 225 MG/1.5ML ~~LOC~~ SOAJ: 225.0000 mg | SUBCUTANEOUS | 11 refills | Status: AC

## 2021-06-25 MED ORDER — AJOVY 225 MG/1.5ML ~~LOC~~ SOAJ: 225.0000 mg | SUBCUTANEOUS | 0 refills | Status: AC

## 2021-06-25 MED ORDER — RIZATRIPTAN BENZOATE 10 MG PO TBDP: 10.0000 mg | ORAL_TABLET | ORAL | 11 refills | Status: AC | PRN

## 2021-06-25 NOTE — Patient Instructions (Addendum)
Ajovy: preventative (Sometimes insurance like [employer]) for prevention ?Emergency: Maxalt:Please take one tablet at the onset of your headache. If it does not improve the symptoms please take one additional tablet. Do not take more then 2 tablets in 24hrs. Do not take use more then 2 to 3 times in a week. ?nurtec once a day as needed ? ?Fremanezumab Injection ?What is this medication? ?FREMANEZUMAB (fre ma NEZ ue mab) prevents migraines. It works by blocking a substance in the body that causes migraines. It is a monoclonal antibody. ?This medicine may be used for other purposes; ask your health care provider or pharmacist if you have questions. ?COMMON BRAND NAME(S): AJOVY ?What should I tell my care team before I take this medication? ?They need to know if you have any of these conditions: ?An unusual or allergic reaction to fremanezumab, other medications, foods, dyes, or preservatives ?Pregnant or trying to get pregnant ?Breast-feeding ?How should I use this medication? ?This medication is injected under the skin. You will be taught how to prepare and give it. Take it as directed on the prescription label. Keep taking it unless your care team tells you to stop. ?It is important that you put your used needles and syringes in a special sharps container. Do not put them in a trash can. If you do not have a sharps container, call your pharmacist or care team to get one. ?Talk to your care team about the use of this medication in children. Special care may be needed. ?Overdosage: If you think you have taken too much of this medicine contact a poison control center or emergency room at once. ?NOTE: This medicine is only for you. Do not share this medicine with others. ?What if I miss a dose? ?If you miss a dose, take it as soon as you can. If it is almost time for your next dose, take only that dose. Do not take double or extra doses. ?What may interact with this medication? ?Interactions are not expected. ?This list  may not describe all possible interactions. Give your health care provider a list of all the medicines, herbs, non-prescription drugs, or dietary supplements you use. Also tell them if you smoke, drink alcohol, or use illegal drugs. Some items may interact with your medicine. ?What should I watch for while using this medication? ?Tell your care team if your symptoms do not start to get better or if they get worse. ?What side effects may I notice from receiving this medication? ?Side effects that you should report to your care team as soon as possible: ?Allergic reactions or angioedema--skin rash, itching or hives, swelling of the face, eyes, lips, tongue, arms, or legs, trouble swallowing or breathing ?Side effects that usually do not require medical attention (report to your care team if they continue or are bothersome): ?Pain, redness, or irritation at injection site ?This list may not describe all possible side effects. Call your doctor for medical advice about side effects. You may report side effects to FDA at 1-800-FDA-1088. ?Where should I keep my medication? ?Keep out of the reach of children and pets. ?Store in a refrigerator or at room temperature between 20 and 25 degrees C (68 and 77 degrees F). ?Refrigeration (preferred): Store in the refrigerator. Do not freeze. Keep in the original container until you are ready to take it. Remove the dose from the carton about 30 minutes before it is time for you to use it. If the dose is not used, it may be stored in  the original container at room temperature for 7 days. Get rid of any unused medication after the expiration date. ?Room Temperature: This medication may be stored at room temperature for up to 7 days. Keep it in the original container. Protect from light until time of use. If it is stored at room temperature, get rid of any unused medication after 7 days or after it expires, whichever is first. ?To get rid of medications that are no longer needed or  have expired: ?Take the medication to a medication take-back program. Check with your pharmacy or law enforcement to find a location. ?If you cannot return the medication, ask your pharmacist or care team how to get rid of this medication safely. ?NOTE: This sheet is a summary. It may not cover all possible information. If you have questions about this medicine, talk to your doctor, pharmacist, or health care provider. ?? 2023 Elsevier/Gold Standard (2021-04-06 00:00:00) ? ? ? ?

## 2021-06-25 NOTE — Telephone Encounter (Signed)
-----   Message from Melvenia Beam, MD sent at 06/25/2021  3:01 PM EDT ----- ?Regarding: Ajovy sample ?Please leave an ajovy sample to patient she will pick up today ? ?

## 2021-06-25 NOTE — Progress Notes (Signed)
?[facility] ? ? ? ?Provider:  Dr Lucia Gaskins ?Requesting Provider: Cydney Ok* ?Primary Care Provider:  Bary Leriche, MD ? ?CC:  headache ? ?Virtual Visit via Video Note ? ?I connected with [name] [name] on 06/25/21 at  2:00 PM EDT by a video enabled telemedicine application and verified that I am speaking with the correct person using two identifiers. ? ?Location: ?Patient: home ?Provider: office ?  ?I discussed the limitations of evaluation and management by telemedicine and the availability of in person appointments. The patient expressed understanding and agreed to proceed. ? ? ? ?Follow Up Instructions: ? ?  ?I discussed the assessment and treatment plan with the patient. The patient was provided an opportunity to ask questions and all were answered. The patient agreed with the plan and demonstrated an understanding of the instructions. ?  ?The patient was advised to call back or seek an in-person evaluation if the symptoms worsen or if the condition fails to improve as anticipated. ? ?I provided 40 minutes of non-face-to-face time during this encounter. ? ? ?Anson Fret, MD ? ?Interval history 06/25/2021: She did not try the medication for the headaches. She has 4 a week but none as bad as what it was that weekend. Ears have popped. CT scan saw an right sphenoid sinus retention cyst may be causing some issues, she sees Dr. Annalee Genta and has had retention cysts removed from maxillary and ethmoid. CTA of the head did not show an aneurysm. She has 16 migraine days a month, daily headaches. Discussed preventatives and acute/emergent medication. Recommend Ajovy  ? ?Tried: Metoprolol, Topamax, Amitriptyline/nortriptyline. Maxalt, imitrex ? ?CTA HEAD ?  ?Anterior circulation: Intracranial internal carotid arteries are ?patent. Anterior and middle cerebral arteries are patent. ?  ?Posterior circulation: Intracranial vertebral arteries, basilar ?artery, and posterior  cerebral arteries are patent. ?  ?Venous sinuses: As permitted by contrast timing, patent. ?  ?Review of the MIP images confirms the above findings. ?  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 As was apparent on that study, there ?is no pituitary macroadenoma or aneurysm. ?  ?  ?MRI brain (with and without) demonstrating: ?- Anterior pituitary mass, partially hypoenhancing, measuring 0.9 x 1.4 x 1.3cm (AP x trans x SI). Variable T1 hyperintensity, T2 hyper and hypointensity signal noted. Pituitary stalk is midline.  Overlying optic chiasm appears normal and without displacement. Appearance suggests pituitary macroadenoma with hemorrhage.  ? ?Brain: There is no acute intracranial hemorrhage, mass effect, or ?edema. Gray-white differentiation is preserved. There is no ?extra-axial fluid collection. Ventricles and sulci are within normal ?limits in size and configuration. ?  ?Vascular: Better evaluated on CTA portion ?  ?Skull: Calvarium is unremarkable. ?  ?Sinuses/Orbits: Right sphenoid retention cyst or polyp. Orbits are ?unremarkable. ?  ?Other: Mastoid air cells are clear. ?  ? ?Patient complains of symptoms per HPI as well as the following symptoms: migraines,back . Pertinent negatives and positives per HPI. All others negative ? ?HPI:  [name] [name] is a 51 y.o. female here as requested by Cydney Ok* for headache. ? ?For the last 3 years she has been having headaches with her periods. She would take excedrin migraine and would help right at onset. She had some migraines in her 36s it went away. She has her last period 02/08/2021, her labs indicate she is menopausal, she had a severe headache, she lost periphera vision bilaterally, severe nausea, light and sound sensitivity, worst  headache of her life, lasted for 4 days, was in bed, severe, pulsating/pounding and throbbing, dazed, confused, felt aphasic, would go to say something  and couldn't articulate it, then she felt a cold burning or a drop on her head, then felt it again, started the 16th of this month. This past Saturday she started having another headaches, felt drops and splatters inside on the left side of the skull, nothing was there, dazed confused, severe pain, severe immediately, thunderclap, she was in the car and got sick, severe headache. She has rolled over in bed and felt a pop behind her nose and pressure relieved and happens 3x a month but a different type of headache more holocephalic in the middle of the night. Both sides of the family had aneurysms, her headaches can be exertional. Maternal aunt has 2 brain aneurysms and fathers side sister and grandmother with abdominal, thoracic aneurysms. She has pulsating hearing in the ear. Her vision has gotten weaker on the left eye. Hasn't seen an eye doctor. In her 4720s she had some aura.  She is having hot flashes. No other focal neurologic deficits, associated symptoms, inciting events or modifiable factors. ? ? ? ?Social History  ? ?Socioeconomic History  ? Marital status: Married  ?  Spouse name: Not on file  ? Number of children: 1  ? Years of education: Not on file  ? Highest education level: Not on file  ?Occupational History  ?  Employer: [employer]  ?Tobacco Use  ? Smoking status: Never  ? Smokeless tobacco: Never  ?Substance and Sexual Activity  ? Alcohol use: Not Currently  ?  Comment: occasionally, none in a long time  ? Drug use: No  ? Sexual activity: Not on file  ?Other Topics Concern  ? Not on file  ?Social History Narrative  ? Lives at home with husband  ? Right handed  ? Caffeine: 1 cup/day  ? ?Social Determinants of Health  ? ?Financial Resource Strain: Not on file  ?Food Insecurity: Not on file  ?Transportation Needs: Not on file  ?Physical Activity: Not on file  ?Stress: Not on file  ?Social Connections: Not on file  ?Intimate Partner Violence: Not on file  ? ? ?Family History  ?Problem  Relation Age of Onset  ? Diabetes Father   ? Hyperlipidemia Father   ? Sleep apnea Father   ? Hypertension Brother   ? Aortic aneurysm Paternal Grandmother   ? Sleep apnea Other   ? AAA (abdominal aortic aneurysm) Paternal Aunt   ? Cerebral aneurysm Maternal Aunt   ? ? ?Past Medical History:  ?Diagnosis Date  ? Arthritis   ? Joint pain;  pt denies any known diagnosis of arthritis  ? Blood transfusion   ? Clotting disorder (HCC)   ? unknown  ? Heart murmur   ? Hypothyroidism   ? ? ?Patient Active Problem List  ? Diagnosis Date Noted  ? Chronic migraine w/o aura w/o status migrainosus, not intractable 06/25/2021  ? 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 Anxiety 10/27/2014  ? Right groin pain 08/20/2010  ? Arthritis   ? ATTENTION DEFICIT DISORDER, ADULT 11/24/2008  ? CORONARY ARTERY SPASM 11/24/2008  ? Chest pain 11/24/2008  ? ? ?Past Surgical History:  ?Procedure Laterality Date  ? CARDIAC CATHETERIZATION    ? groin 2001  ? CERVICAL BIOPSY    ? ECTOPIC PREGNANCY SURGERY    ? NASAL SEPTUM SURGERY    ? TONSILLECTOMY    ? ? ?Current Outpatient Medications  ?  Medication Sig Dispense Refill  ? Fremanezumab-vfrm (AJOVY) 225 MG/1.5ML SOAJ Inject 225 mg into the skin every 30 (thirty) days. 1.5 mL 11  ? rizatriptan (MAXALT-MLT) 10 MG disintegrating tablet Take 1 tablet (10 mg total) by mouth as needed for migraine. May repeat in 2 hours if needed 9 tablet 11  ? Amino Acids (AMINO BALANCE PO) Take 1 scoop by mouth daily.    ? Biotin 29937 MCG TABS Take 1 tablet by mouth every other day.    ? Coenzyme Q10 200 MG capsule Take 200 mg by mouth daily.    ? Fremanezumab-vfrm (AJOVY) 225 MG/1.5ML SOAJ Inject 225 mg into the skin every 30 (thirty) days. 1.68 mL 0  ? lidocaine (XYLOCAINE) 2 % jelly lidocaine HCl 2 % mucosal jelly ? PLACE 1 GRAM OF TOPICAL EVERY DAY APPLY SMALL AMOUNT TO URETHRAL MEATUS PRIOR TO SELF CATHETERIZATION    ? magnesium oxide (MAG-OX) 400 MG tablet Take 400 mg by mouth daily.    ? Multiple Vitamin (MV-ONE PO) Take  1 tablet by mouth daily.    ? Rimegepant Sulfate (NURTEC) 75 MG TBDP Take 75 mg by mouth daily as needed. For migraines. Take as close to onset of migraine as possible. One daily maximum. 8 tablet 0  ? traMADol (

## 2021-06-25 NOTE — Telephone Encounter (Signed)
Sample in refrigerator for pt to pick up.   ?

## 2021-06-26 NOTE — Telephone Encounter (Signed)
Pt picked up this Monday (with instructions). ?

## 2021-06-27 ENCOUNTER — Encounter: Payer: Self-pay | Admitting: Neurology

## 2021-06-27 ENCOUNTER — Telehealth: Payer: Self-pay | Admitting: Neurology

## 2021-06-27 ENCOUNTER — Other Ambulatory Visit: Payer: Self-pay | Admitting: Neurology

## 2021-06-27 MED ORDER — TOPIRAMATE 50 MG PO TABS: 50.0000 mg | ORAL_TABLET | Freq: Every evening | ORAL | 6 refills | Status: AC

## 2021-06-27 NOTE — Telephone Encounter (Signed)
Pt states her insurance will not cover Ajovy so she would like to go with Topamax, please call to discuss. ?

## 2021-06-27 NOTE — Telephone Encounter (Signed)
Spoke with patient is hesitant  to try Ajovy because she read online reviews and states in review that Ajovy can cause hair loss .Pt wants to try Topamax . Will forward to Dr.Ahern to make her aware  ?

## 2021-06-28 NOTE — Telephone Encounter (Signed)
Sent my chart message this am to make patient aware Dr Lucia Gaskins sent   a prescription of Topamax to pharmacy . Dr Lucia Gaskins also sent her a mychart message about the side effects of Topamax  ? ?

## 2021-07-10 NOTE — Therapy (Signed)
?OUTPATIENT PHYSICAL THERAPY FEMALE PELVIC EVALUATION ? ? ?Patient Name: [name] [name] ?MRN: [id number] ?DOB:[birth year], 51 y.o., female ?Today's Date: 07/11/2021 ? ? PT End of Session - 07/11/21 0841   ? ? Visit Number 1   ? Date for PT Re-Evaluation 10/03/21   ? Authorization Type [employer]   ? PT Start Time [phone]   ? PT Stop Time [id number]   ? PT Time Calculation (min) 47 min   ? Activity Tolerance Patient tolerated treatment well   ? Behavior During Therapy [facility] for tasks assessed/performed   ? ?  ?  ? ?  ? ? ?Past Medical History:  ?Diagnosis Date  ? Arthritis   ? Joint pain;  pt denies any known diagnosis of arthritis  ? Blood transfusion   ? Clotting disorder (HCC)   ? unknown  ? Heart murmur   ? Hypothyroidism   ? ?Past Surgical History:  ?Procedure Laterality Date  ? CARDIAC CATHETERIZATION    ? groin 2001  ? CERVICAL BIOPSY    ? ECTOPIC PREGNANCY SURGERY    ? NASAL SEPTUM SURGERY    ? TONSILLECTOMY    ? ?Patient Active Problem List  ? Diagnosis Date Noted  ? Chronic migraine w/o aura w/o status migrainosus, not intractable 06/25/2021  ? 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 Anxiety 10/27/2014  ? Right groin pain 08/20/2010  ? Arthritis   ? ATTENTION DEFICIT DISORDER, ADULT 11/24/2008  ? CORONARY ARTERY SPASM 11/24/2008  ? Chest pain 11/24/2008  ? ? ?PCP: Bary Leriche, MD ? ?REFERRING PROVIDER: Vanessa Barbara, NP ? ?REFERRING DIAG: N35.92 (ICD-10-CM) - Unspecified urethral stricture, female ?R39.16 (ICD-10-CM) - Straining to void ?R10.2 (ICD-10-CM) - Pelvic and perineal pain ? ?THERAPY DIAG:  ?Unspecified lack of coordination ? ?Muscle weakness (generalized) ? ?Other muscle spasm ? ?ONSET DATE: started as child ? ?SUBJECTIVE:                                                                                                                                                                                          ? ?SUBJECTIVE STATEMENT: ?Patient states that she was diagnosed when she was 55 months old with  urethral stenosis. Throughout her life she has had difficulty with urinating. She has had periods with no difficulty and then have periods where it would take her 30 minutes to finish urinating. Every couple years she will get dilated. Now she is having to self-cath which is very painful. She is also having increased difficulty with the catheterization and MD has told her that this may be in part due to muscular spasm. She will have bladder spasms after self-cath for  about 2 weeks. She recently tried vaginal valium for the first time and states that it was very helpful. ?Fluid intake: Yes: Not enough - multiple energy drinks throughout the day   ? ?Patient confirms identification and approves PT to assess pelvic floor and treatment Yes ? ? ?PAIN:  ?Are you having pain? No ? ? ?PRECAUTIONS: None ? ?WEIGHT BEARING RESTRICTIONS No ? ?FALLS:  ?Has patient fallen in last 6 months? No ? ?LIVING ENVIRONMENT: ?Lives with: lives with their family ?Lives in: House/apartment ? ? 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 ?PLOF: Independent ? ?PATIENT GOALS To increase ease of urination ? ?PERTINENT HISTORY:  ?Urethral restriction since 18 months; history of self-cath; hormonal migraines associated with menopause ?Sexual abuse: No ? ?BOWEL MOVEMENT ?Pain with bowel movement: No ?Type of bowel movement:Frequency 1x/day and Strain Yes- she will get low back and hip pain when severe ?Fully empty rectum: No ?Leakage: No ?Pads: No ?Fiber supplement: No - but she takes vegetable laxative daily in order to have bowel movement ?*She does use squatty potty ? ?URINATION ?Pain with urination: No - just takes very long time ?Fully empty bladder: No ?Stream: Weak and intermittent ?Urgency: No ?Frequency: 7 hours in between ?Leakage:  None ?Pads: No ? ?INTERCOURSE ?Pain with intercourse:  No pain ?Ability to have vaginal penetration:  Yes: - ?Climax: yes ? ? ?PREGNANCY ?Vaginal deliveries 1 ?Tearing No ?C-section deliveries 0 ? ? ? ? ? ?OBJECTIVE:   ? ?DIAGNOSTIC FINDINGS:  ?Bladder studies ? ? ?COGNITION: ? Overall cognitive status: Within functional limits for tasks assessed   ?  ?SENSATION: ? Light touch: Appears intact ? Proprioception: Appears intact ? ? ?GAIT: ? ?Comments: WNL ? ?POSTURE:  ?WNL ? ?LUMBARAROM/PROM ?WNL ? ?PELVIC MMT:  3/5 strength, 6 second endurance, poor coordination with repeat contraction but able to perform 10 without full A/ROM ?  ? ?      PALPATION: ?  General  No abdominal restriction ? ?              External Perineal Exam changes consistent with decrease in estrogen (labia minora fusion and palor) ?              ?              Internal Pelvic Floor mild dryness, no tenderness, moderate urethral restriction ? ?TONE: ?Moderate ? ? ?TODAY'S TREATMENT 07/11/21: ?EVAL  ?Manual: ? ?Internal pelvic floor techniques: ?No emotional/communication barriers or cognitive limitation. Patient is motivated to learn. Patient understands and agrees with treatment goals and plan. PT explains patient will be examined in standing, sitting, and lying down to see how their muscles and joints work. When they are ready, they will be asked to remove their underwear so PT can examine their perineum. The patient is also given the option of providing their own chaperone as one is not provided in our facility. The patient also has the right and is explained the right to defer or refuse any part of the evaluation or treatment including the internal exam. With the patient's consent, PT will use one gloved finger to gently assess the muscles of the pelvic floor, seeing how well it contracts and relaxes and if there is muscle symmetry. After, the patient will get dressed and PT and patient will discuss exam findings and plan of care. PT and patient discuss plan of care, schedule, attendance policy and HEP activities. ? ?Neuromuscular re-education: ?Pelvic floor contraction training: ?Pelvic floor A/ROM training with emphasis on bulge/relaxation and  breath  coordination ?Down training: ?Diaphragmatic breathing with pelvic floor muscle bulge ?Self-care: ?Started discussing bladder retraining, importance of regular water intake/voiding, and bladder irritants ?Relaxation and stress impact on toileting ?Squatty potty and use of relaxed toileting mechanics ? ? ? ?PATIENT EDUCATION:  ?Education details: See above self-care ?Person educated: Patient ?Education method: Explanation, Demonstration, Tactile cues, Verbal cues, and Handouts ?Education comprehension: verbalized understanding ? ? ?HOME EXERCISE PROGRAM: ?[id number] ? ?ASSESSMENT: ? ?CLINICAL IMPRESSION: ?Patient is a 51 y.o. female who was seen today for physical therapy evaluation and treatment for incomplete emptying of bladder and bladder spasms after self-cath. Exam findings notable for moderate increase in pelvic floor tone, no tenderness throughout pelvic floor, some restriction in urethral mobility, pelvic floor weakness 3/5, endurance 6 seconds, and 10 repeat contractions without appropriate coordination or full A/ROM. Signs and symptoms are most consistent with superficial pelvic floor spasm at time of toileting and after self-cath with significant contribution from poor bladder habits that have developed over the years. Pt education performed on role of pelvic floor tension and bladder habits on ability to void. Initial treatment included pelvic floor A/ROM and bulge training with diaphragmatic breathing to help improve coordination and overall pelvic floor relaxation. She will likely benefit from skilled PT intervention in order to improve complete urinary voiding, decrease bladder spasms after self-cath, and improve QOL.  ? ? ?OBJECTIVE IMPAIRMENTS decreased activity tolerance, decreased coordination, decreased endurance, decreased mobility, decreased strength, hypomobility, increased fascial restrictions, increased muscle spasms, impaired tone, and pain.  ? ?ACTIVITY LIMITATIONS  urinating .  ? ?PERSONAL  FACTORS 1 comorbidity: chronic urethral stenosis  are also affecting patient's functional outcome.  ? ? ?REHAB POTENTIAL: Good ? ?CLINICAL DECISION MAKING: Stable/uncomplicated ? ?EVALUATION COMPLEXITY: Low ? ? ?GOA

## 2021-07-11 ENCOUNTER — Ambulatory Visit: Payer: Managed Care, Other (non HMO) | Attending: Adult Health

## 2021-07-11 DIAGNOSIS — R279 Unspecified lack of coordination: Secondary | ICD-10-CM | POA: Insufficient documentation

## 2021-07-11 DIAGNOSIS — N3592 Unspecified urethral stricture, female: Secondary | ICD-10-CM | POA: Diagnosis present

## 2021-07-11 DIAGNOSIS — M62838 Other muscle spasm: Secondary | ICD-10-CM | POA: Diagnosis not present

## 2021-07-11 DIAGNOSIS — R102 Pelvic and perineal pain: Secondary | ICD-10-CM | POA: Insufficient documentation

## 2021-07-11 DIAGNOSIS — M6281 Muscle weakness (generalized): Secondary | ICD-10-CM | POA: Insufficient documentation

## 2021-07-11 DIAGNOSIS — R3916 Straining to void: Secondary | ICD-10-CM | POA: Insufficient documentation

## 2021-10-15 ENCOUNTER — Other Ambulatory Visit: Payer: Self-pay | Admitting: Urology

## 2021-10-15 DIAGNOSIS — N3592 Unspecified urethral stricture, female: Secondary | ICD-10-CM

## 2021-11-01 ENCOUNTER — Other Ambulatory Visit: Payer: Managed Care, Other (non HMO)

## 2021-11-12 ENCOUNTER — Ambulatory Visit
Admission: RE | Admit: 2021-11-12 | Discharge: 2021-11-12 | Disposition: A | Discharge status: Home / Self Care | Payer: Managed Care, Other (non HMO) | Source: Ambulatory Visit | Attending: Urology | Admitting: Urology

## 2021-11-12 DIAGNOSIS — N3592 Unspecified urethral stricture, female: Secondary | ICD-10-CM

## 2021-11-12 MED ORDER — GADOBENATE DIMEGLUMINE 529 MG/ML IV SOLN
12.0000 mL | Freq: Once | INTRAVENOUS | Status: AC | PRN
  Administered 2021-11-12: 12 mL via INTRAVENOUS

## 2022-01-31 ENCOUNTER — Ambulatory Visit (INDEPENDENT_AMBULATORY_CARE_PROVIDER_SITE_OTHER): Payer: Managed Care, Other (non HMO) | Admitting: Podiatry

## 2022-01-31 DIAGNOSIS — M21619 Bunion of unspecified foot: Secondary | ICD-10-CM

## 2022-01-31 DIAGNOSIS — M722 Plantar fascial fibromatosis: Secondary | ICD-10-CM

## 2022-01-31 NOTE — Progress Notes (Signed)
Subjective:   Patient ID: [name] [name], female   DOB: 51 y.o.   MRN: [id number]   HPI Chief Complaint  Patient presents with   Fibromatosis     Patient came in today for injections in the left foot, plantar fibromatosis, patient has stabbing pain at night, rate of pain 7 out of 19,    51 year old female presents the office today with above concerns.  She is requesting injection on the left foot for the plantar fibroma.  She states that this is a newer lesion that is causing pain but has multiple lesions on the lateral of her left foot.  She gets a stabbing pain at night.  No recent treatment.  As an aside she does have bunions.  Not causing significant pain.  Review of Systems  All other systems reviewed and are negative.  Past Medical History:  Diagnosis Date   Arthritis    Joint pain;  pt denies any known diagnosis of arthritis   Blood transfusion    Clotting disorder (HCC)    unknown   Heart murmur    Hypothyroidism     Past Surgical History:  Procedure Laterality Date   CARDIAC CATHETERIZATION     groin 2001   CERVICAL BIOPSY     ECTOPIC PREGNANCY SURGERY     NASAL SEPTUM SURGERY     TONSILLECTOMY       Current Outpatient Medications:    Amino Acids (AMINO BALANCE PO), Take 1 scoop by mouth daily., Disp: , Rfl:    Biotin 74259 MCG TABS, Take 1 tablet by mouth every other day., Disp: , Rfl:    Coenzyme Q10 200 MG capsule, Take 200 mg by mouth daily., Disp: , Rfl:    Fremanezumab-vfrm (AJOVY) 225 MG/1.5ML SOAJ, Inject 225 mg into the skin every 30 (thirty) days., Disp: 1.5 mL, Rfl: 11   Fremanezumab-vfrm (AJOVY) 225 MG/1.5ML SOAJ, Inject 225 mg into the skin every 30 (thirty) days., Disp: 1.68 mL, Rfl: 0   lidocaine (XYLOCAINE) 2 % jelly, lidocaine HCl 2 % mucosal jelly  PLACE 1 GRAM OF TOPICAL EVERY DAY APPLY SMALL AMOUNT TO URETHRAL MEATUS PRIOR TO SELF CATHETERIZATION (Patient not taking: Reported on 01/31/2022), Disp: , Rfl:    magnesium oxide (MAG-OX) 400 MG  tablet, Take 400 mg by mouth daily., Disp: , Rfl:    Multiple Vitamin (MV-ONE PO), Take 1 tablet by mouth daily., Disp: , Rfl:    Rimegepant Sulfate (NURTEC) 75 MG TBDP, Take 75 mg by mouth daily as needed. For migraines. Take as close to onset of migraine as possible. One daily maximum., Disp: 8 tablet, Rfl: 0   rizatriptan (MAXALT-MLT) 10 MG disintegrating tablet, Take 1 tablet (10 mg total) by mouth as needed for migraine. May repeat in 2 hours if needed (Patient not taking: Reported on 01/31/2022), Disp: 9 tablet, Rfl: 11   topiramate (TOPAMAX) 50 MG tablet, Take 1 tablet (50 mg total) by mouth at bedtime. (Patient not taking: Reported on 01/31/2022), Disp: 30 tablet, Rfl: 6   traMADol (ULTRAM) 50 MG tablet, tramadol 50 mg tablet  Take 1 tablet every 6 hours by oral route as needed. (Patient not taking: Reported on 01/31/2022), Disp: , Rfl:    VENTOLIN HFA 108 (90 BASE) MCG/ACT inhaler, Inhale 2 puffs into the lungs every 6 (six) hours as needed for wheezing or shortness of breath. , Disp: , Rfl:    zolpidem (AMBIEN) 5 MG tablet, TK 1 T PO QD HS, Disp: , Rfl:  Current Facility-Administered Medications:    triamcinolone acetonide (KENALOG-40) injection 40 mg, 40 mg, Other, Once, Sikora, Richard, DPM  Allergies  Allergen Reactions   Amoxicillin-Pot Clavulanate Rash    REACTION: rash   Augmentin [Amoxicillin-Pot Clavulanate] Rash           Objective:  Physical Exam  General: AAO x3, NAD  Dermatological: Skin is warm, dry and supple bilateral. There are no open sores, no preulcerative lesions, no rash or signs of infection present.  Vascular: Dorsalis Pedis artery and Posterior Tibial artery pedal pulses are 2/4 bilateral with immedate capillary fill time. There is no pain with calf compression, swelling, warmth, erythema.   Neruologic: Grossly intact via light touch bilateral.  Negative Tinel sign.  Musculoskeletal: Mild bunions present bilaterally.  Not causing significant pain  there is no pain or crepitation with MPJ range of motion.  No hypermobility of the first ray.  On the medial band plantar fascia the arch of the foot is firm soft tissue masses consistent with a plantar fibroma.  No edema, erythema.  Tenderness palpation over the long lesions.  Muscular strength 5/5 in all groups tested bilateral.  Gait: Unassisted, Nonantalgic.       Assessment:   Plantar fibromatosis left foot; bunion     Plan:  -Treatment options discussed including all alternatives, risks, and complications -Etiology of symptoms were discussed -X-rays were obtained reviewed of the left foot.  3 views were obtained.  No evidence of acute fracture.  Mild bunions present.  No calcifications or soft tissue mass noted. -Steroid injection performed to the plantar fibroma x 2.  Skin was cleaned with alcohol and mixture of 1 cc Kenalog 10, 0.5 cc Marcaine plain, 0.5 cc of lidocaine plain was infiltrated into the 2 lesions on the plantar aspect left foot from a plantar approach.  She tolerated the procedure well any complications.  Postinjection care discussed.  Tolerated well. -I ordered a compound cream through [facility] to help with the plantar fibromas as well. -Stretching, icing -Regards to the bunions continue shoes, good arch support as well as offloading.  Vivi Barrack DPM

## 2022-01-31 NOTE — Patient Instructions (Signed)
